# Patient Record
Sex: Male | Born: 1965 | Race: Black or African American | Hispanic: No | Marital: Married | Smoking: Former smoker
Health system: Southern US, Community
[De-identification: ages and names within clinical notes are randomized; demographics above are authoritative.]

## PROBLEM LIST (undated history)

## (undated) DIAGNOSIS — K219 Gastro-esophageal reflux disease without esophagitis: Secondary | ICD-10-CM

## (undated) DIAGNOSIS — R202 Paresthesia of skin: Secondary | ICD-10-CM

## (undated) DIAGNOSIS — R200 Anesthesia of skin: Secondary | ICD-10-CM

## (undated) DIAGNOSIS — J309 Allergic rhinitis, unspecified: Secondary | ICD-10-CM

## (undated) DIAGNOSIS — E785 Hyperlipidemia, unspecified: Secondary | ICD-10-CM

## (undated) DIAGNOSIS — R7303 Prediabetes: Secondary | ICD-10-CM

## (undated) HISTORY — PX: HERNIA REPAIR: SHX51

## (undated) HISTORY — DX: Paresthesia of skin: R20.2

## (undated) HISTORY — DX: Prediabetes: R73.03

## (undated) HISTORY — PX: NECK SURGERY: SHX720

## (undated) HISTORY — PX: FOOT SURGERY: SHX648

## (undated) HISTORY — DX: Hyperlipidemia, unspecified: E78.5

## (undated) HISTORY — DX: Gastro-esophageal reflux disease without esophagitis: K21.9

## (undated) HISTORY — DX: Allergic rhinitis, unspecified: J30.9

---

## 1999-09-19 ENCOUNTER — Encounter: Payer: Self-pay | Admitting: Emergency Medicine

## 1999-09-19 ENCOUNTER — Emergency Department (HOSPITAL_COMMUNITY): Admission: EM | Admit: 1999-09-19 | Discharge: 1999-09-19 | Payer: Self-pay | Admitting: Emergency Medicine

## 2000-06-04 ENCOUNTER — Other Ambulatory Visit: Admission: RE | Admit: 2000-06-04 | Discharge: 2000-06-04 | Payer: Self-pay | Admitting: Family Medicine

## 2000-06-24 ENCOUNTER — Emergency Department (HOSPITAL_COMMUNITY): Admission: EM | Admit: 2000-06-24 | Discharge: 2000-06-24 | Payer: Self-pay | Admitting: *Deleted

## 2000-06-26 ENCOUNTER — Encounter: Admission: RE | Admit: 2000-06-26 | Discharge: 2000-06-26 | Payer: Self-pay | Admitting: Orthopedic Surgery

## 2001-03-04 ENCOUNTER — Emergency Department (HOSPITAL_COMMUNITY): Admission: EM | Admit: 2001-03-04 | Discharge: 2001-03-04 | Payer: Self-pay

## 2004-09-19 ENCOUNTER — Emergency Department (HOSPITAL_COMMUNITY): Admission: EM | Admit: 2004-09-19 | Discharge: 2004-09-20 | Payer: Self-pay | Admitting: Emergency Medicine

## 2009-06-09 ENCOUNTER — Encounter: Admission: RE | Admit: 2009-06-09 | Discharge: 2009-06-09 | Payer: Self-pay | Admitting: Neurology

## 2009-06-18 ENCOUNTER — Encounter: Admission: RE | Admit: 2009-06-18 | Discharge: 2009-06-18 | Payer: Self-pay

## 2009-06-24 ENCOUNTER — Encounter: Admission: RE | Admit: 2009-06-24 | Discharge: 2009-06-24 | Payer: Self-pay | Admitting: Neurology

## 2009-08-23 ENCOUNTER — Ambulatory Visit (HOSPITAL_COMMUNITY): Admission: RE | Admit: 2009-08-23 | Discharge: 2009-08-23 | Payer: Self-pay | Admitting: Neurology

## 2010-01-17 ENCOUNTER — Encounter: Admission: RE | Admit: 2010-01-17 | Discharge: 2010-01-17 | Payer: Self-pay | Admitting: Neurology

## 2010-06-26 ENCOUNTER — Encounter: Payer: Self-pay | Admitting: Neurology

## 2010-08-29 LAB — CSF CELL COUNT WITH DIFFERENTIAL

## 2010-08-29 LAB — OLIGOCLONAL BANDS, CSF + SERM

## 2010-08-29 LAB — PROTEIN AND GLUCOSE, CSF
Glucose, CSF: 58 mg/dL (ref 43–76)
Total  Protein, CSF: 71 mg/dL — ABNORMAL HIGH (ref 15–45)

## 2011-05-25 ENCOUNTER — Emergency Department (INDEPENDENT_AMBULATORY_CARE_PROVIDER_SITE_OTHER)
Admission: EM | Admit: 2011-05-25 | Discharge: 2011-05-25 | Disposition: A | Discharge status: Home / Self Care | Payer: 59 | Source: Home / Self Care | Attending: Family Medicine | Admitting: Family Medicine

## 2011-05-25 ENCOUNTER — Encounter: Payer: Self-pay | Admitting: Emergency Medicine

## 2011-05-25 DIAGNOSIS — S39012A Strain of muscle, fascia and tendon of lower back, initial encounter: Secondary | ICD-10-CM

## 2011-05-25 DIAGNOSIS — S339XXA Sprain of unspecified parts of lumbar spine and pelvis, initial encounter: Secondary | ICD-10-CM

## 2011-05-25 HISTORY — DX: Anesthesia of skin: R20.0

## 2011-05-25 MED ORDER — HYDROCODONE-ACETAMINOPHEN 5-325 MG PO TABS: 2.0000 | ORAL_TABLET | ORAL | Status: AC | PRN

## 2011-05-25 MED ORDER — METAXALONE 800 MG PO TABS: 800.0000 mg | ORAL_TABLET | Freq: Three times a day (TID) | ORAL | Status: AC

## 2011-05-25 MED ORDER — PREDNISONE 20 MG PO TABS: ORAL_TABLET | ORAL | Status: AC

## 2011-05-25 MED ORDER — MELOXICAM 15 MG PO TABS: 15.0000 mg | ORAL_TABLET | Freq: Every day | ORAL | Status: AC

## 2011-05-25 NOTE — ED Provider Notes (Signed)
History     CSN: [id number]  Arrival date & time 05/25/11  1025   First MD Initiated Contact with Patient 05/25/11 1131      Chief Complaint  Patient presents with  . Back Pain    HPI Comments: Jonathan Downs is a 45 y.o. male who complains of sharp constant low back pain in lubosacral area x 2 days. No known injury, change in physical activity. The pain is positional with bending or lifting, w/o radiation down the legs.  Similar sx before but not as severe.  No N/V, cougfh, wheeze, CP, SOB, urinary c/o, fevers, abd pain, saddle anesthesia, unintentional weight loss, h/o IVDu, h/o CA, weakness in legs. States no change in numbness in feet. Has had extensive neurology workup for foot numbness in past with MRI's which were neg per pt and spouse. Tried heating pad,  vicodin left over from dental surgery  with improvement. Has not tried anything else for pain.   The history is provided by the patient and the spouse. No language interpreter was used.    Past Medical History  Diagnosis Date  . Numbness in feet     Past Surgical History  Procedure Date  . Hernia repair   . Foot surgery   . Neck surgery     History reviewed. No pertinent family history.  History  Substance Use Topics  . Smoking status: Current Everyday Smoker  . Smokeless tobacco: Not on file  . Alcohol Use: Yes      Review of Systems  All other systems reviewed and are negative.    Allergies  Review of patient's allergies indicates no known allergies.  Home Medications   Current Outpatient Rx  Name Route Sig Dispense Refill  . HYDROCODONE-ACETAMINOPHEN 5-325 MG PO TABS Oral Take 2 tablets by mouth every 4 (four) hours as needed for pain. 20 tablet 0  . MELOXICAM 15 MG PO TABS Oral Take 1 tablet (15 mg total) by mouth daily. 14 tablet 0  . METAXALONE 800 MG PO TABS Oral Take 1 tablet (800 mg total) by mouth 3 (three) times daily. 21 tablet 0  . PREDNISONE 20 MG PO TABS  Take 3 tabs po on first day,  2 tabs second day, 2 tabs third day, 1 tab fourth day, 1 tab 5th day. Take with food. 9 tablet 0    BP 112/84  Pulse 62  Temp(Src) 98 F (36.7 C) (Oral)  Resp 12  SpO2 99%  Physical Exam  Nursing note and vitals reviewed. Constitutional: He is oriented to person, place, and time. He appears well-developed and well-nourished.  HENT:  Head: Normocephalic and atraumatic.  Eyes: Conjunctivae and EOM are normal. Pupils are equal, round, and reactive to light.  Neck: Normal range of motion.  Cardiovascular: Normal rate, regular rhythm, normal heart sounds and normal pulses.   Pulmonary/Chest: Effort normal. No respiratory distress.  Abdominal: Soft. Bowel sounds are normal. He exhibits no distension and no mass. There is no tenderness. There is no guarding and no CVA tenderness.  Musculoskeletal: Normal range of motion.       Bilateral paralumbar and upper sacral soft tissue tenderness. No bony tenderness of L spine, sacrum. Bilateral lower extremities nontender  baseline ROM with intact PT pulses,  Sensation baseline light touch bilaterally for Pt, DTR's symmetric and intact bilaterally KJ, Motor symmetric bilateral 5/5 hip flexion, quadriceps, hamstrings, EHL, foot dorsiflexion, foot plantarflexion, gait normal. No pain with int/ext rotation hips, SLR neg bilaterally. Pain with bending forward,  going from lying to sitting, torso rotation.    Neurological: He is alert and oriented to person, place, and time.  Skin: Skin is warm and dry.  Psychiatric: He has a normal mood and affect. His behavior is normal.    ED Course  Procedures (including critical care time)  Labs Reviewed - No data to display No results found.   1. Lumbosacral strain       MDM  [address] narcotic database queried one narcotic rx from 12/22/2010 filled in past year. No previous records available.  No evidence of uti, nephrolithiasis. No evidence of spinal cord involvement based on H&P. Pt describing typical back  pain, has been <6 week duration. No red flags such as fevers, age >50, h/o trauma with bony tenderness, neurological deficits, h/o CA, unexplained weight loss, pain worse at night, pain at rest,  h/o prolonged steroid use, h/o osteopenia, h/o IVDU. Imaging not indicated at this time.     PLAN: For acute pain, rest, intermittent application of cold packs (later, may switch to heat, but do not sleep on heating pad), analgesics and muscle relaxants, steriods. Proper lifting with avoidance of heavy lifting discussed. Consider Physical Therapy and XRay studies if not improving. Call or return to clinic prn if these symptoms worsen or fail to improve as anticipated.   Luiz Blare, MD 05/26/11 [phone]

## 2011-05-25 NOTE — ED Notes (Signed)
Pt here with sudden lower back pain without injury or strain that started yesterday.pt c/o intermitt throb pain with any movement or certain position.denies radiation pain

## 2018-02-05 ENCOUNTER — Emergency Department (HOSPITAL_COMMUNITY): Payer: BC Managed Care – PPO

## 2018-02-05 ENCOUNTER — Other Ambulatory Visit: Payer: Self-pay

## 2018-02-05 ENCOUNTER — Encounter (HOSPITAL_COMMUNITY): Payer: Self-pay

## 2018-02-05 ENCOUNTER — Emergency Department (HOSPITAL_COMMUNITY)
Admission: EM | Admit: 2018-02-05 | Discharge: 2018-02-06 | Disposition: A | Discharge status: Home / Self Care | Payer: BC Managed Care – PPO | Attending: Emergency Medicine | Admitting: Emergency Medicine

## 2018-02-05 DIAGNOSIS — M79621 Pain in right upper arm: Secondary | ICD-10-CM | POA: Diagnosis not present

## 2018-02-05 DIAGNOSIS — R079 Chest pain, unspecified: Secondary | ICD-10-CM | POA: Diagnosis present

## 2018-02-05 DIAGNOSIS — Z87891 Personal history of nicotine dependence: Secondary | ICD-10-CM | POA: Diagnosis not present

## 2018-02-05 DIAGNOSIS — R072 Precordial pain: Secondary | ICD-10-CM | POA: Diagnosis not present

## 2018-02-05 LAB — BASIC METABOLIC PANEL
Anion gap: 10 (ref 5–15)
BUN: 16 mg/dL (ref 6–20)
CHLORIDE: 103 mmol/L (ref 98–111)
CO2: 25 mmol/L (ref 22–32)
CREATININE: 1.43 mg/dL — AB (ref 0.61–1.24)
Calcium: 9.4 mg/dL (ref 8.9–10.3)
GFR calc non Af Amer: 56 mL/min — ABNORMAL LOW (ref >60)
Glucose, Bld: 102 mg/dL — ABNORMAL HIGH (ref 70–99)
POTASSIUM: 3.2 mmol/L — AB (ref 3.5–5.1)
SODIUM: 138 mmol/L (ref 135–145)

## 2018-02-05 LAB — CBC
HCT: 45.8 % (ref 39.0–52.0)
Hemoglobin: 14.3 g/dL (ref 13.0–17.0)
MCH: 26.9 pg (ref 26.0–34.0)
MCHC: 31.2 g/dL (ref 30.0–36.0)
MCV: 86.1 fL (ref 78.0–100.0)
Platelets: 233 10*3/uL (ref 150–400)
RBC: 5.32 MIL/uL (ref 4.22–5.81)
RDW: 13.1 % (ref 11.5–15.5)
WBC: 5.5 10*3/uL (ref 4.0–10.5)

## 2018-02-05 LAB — I-STAT TROPONIN, ED: Troponin i, poc: 0 ng/mL (ref 0.00–0.08)

## 2018-02-05 NOTE — ED Provider Notes (Signed)
Patient placed in Quick Look pathway, seen and evaluated   Chief Complaint: CP  HPI:   Patient is a 52 year old male who presents the emergency department today for evaluation of right-sided chest pain and right arm pain that has been intermittent for the last week.  Pain began the day after he worked out.  Rates pain 6/10.  Not provoked by exertion.  Not associated with shortness of breath however does report intermittent pain with inspiration.  No lower extremity swelling.  No recent admissions to the hospital, surgeries or periods of extended travel.  No personal history of cancer or DVT/PE.  Patient has history of "borderline high blood pressure "but is not on medication for this.  Denies tobacco use or history of hyperlipidemia.  ROS: Chest pain (one)  Physical Exam:   Gen: No distress  Neuro: Awake and Alert  Skin: Warm    Focused Exam: No right-sided chest wall tenderness or tenderness to the right upper extremity.  RRR. CTAB. Distal pulses intact. Not diaphoretic. No BLE edema.   Initiation of care has begun. The patient has been counseled on the process, plan, and necessity for staying for the completion/evaluation, and the remainder of the medical screening examination  Pt advised to inform nursing staff immediately if they experience any new or worsening of symptoms while waiting in the waiting room.    Bishop Dublin 02/05/18 Zoe Lan, MD 02/06/18 [phone]

## 2018-02-05 NOTE — ED Triage Notes (Signed)
Pt here from home for chest pain and right arm for for a week.  Stated that the pain is central chest and comes and goes.  A&Ox4.

## 2018-02-06 NOTE — ED Provider Notes (Signed)
[facility] EMERGENCY DEPARTMENT Provider Note   CSN: [id number] Arrival date & time: 02/05/18  1062     History   Chief Complaint Chief Complaint  Patient presents with  . Chest Pain  . Arm Pain    HPI Jonathan Downs is a 52 y.o. male.  HPI 52 year old male presents emergency department with intermittent right axillary and right lateral chest discomfort which comes and goes.  He just started lifting weights again.  He has not tried any medication prior to arrival.  His symptoms have been ongoing for the past 6 days.  Is worse with palpation and movement of his right lateral chest and pectoral region.  No weakness or numbness in his right arm.  Normal grip strength right arm.   Past Medical History:  Diagnosis Date  . Numbness in feet     There are no active problems to display for this patient.   Past Surgical History:  Procedure Laterality Date  . FOOT SURGERY    . HERNIA REPAIR    . NECK SURGERY          Home Medications    Prior to Admission medications   Not on File    Family History History reviewed. No pertinent family history.  Social History Social History   Tobacco Use  . Smoking status: Former Research scientist (life sciences)  . Smokeless tobacco: Never Used  Substance Use Topics  . Alcohol use: Yes  . Drug use: No     Allergies   Patient has no known allergies.   Review of Systems Review of Systems  All other systems reviewed and are negative.    Physical Exam Updated Vital Signs BP (!) 156/104 (BP Location: Left Arm)   Pulse (!) 54   Temp 97.6 F (36.4 C) (Oral)   Resp 14   SpO2 100%   Physical Exam  Constitutional: He is oriented to person, place, and time. He appears well-developed and well-nourished.  HENT:  Head: Normocephalic and atraumatic.  Eyes: EOM are normal.  Neck: Normal range of motion.  Cardiovascular: Normal rate, regular rhythm, normal heart sounds and intact distal pulses.  Pulmonary/Chest: Effort normal  and breath sounds normal. No respiratory distress.  Abdominal: Soft. He exhibits no distension. There is no tenderness.  Musculoskeletal: Normal range of motion.  Right lateral chest wall tenderness.  No axillary lymph nodes palpable.  Neurological: He is alert and oriented to person, place, and time.  Skin: Skin is warm and dry.  Psychiatric: He has a normal mood and affect. Judgment normal.  Nursing note and vitals reviewed.    ED Treatments / Results  Labs (all labs ordered are listed, but only abnormal results are displayed) Labs Reviewed  BASIC METABOLIC PANEL - Abnormal; Notable for the following components:      Result Value   Potassium 3.2 (*)    Glucose, Bld 102 (*)    Creatinine, Ser 1.43 (*)    GFR calc non Af Amer 56 (*)    All other components within normal limits  CBC  I-STAT TROPONIN, ED    EKG EKG Interpretation  Date/Time:  Tuesday February 05 2018 18:26:20 EDT Ventricular Rate:  72 PR Interval:  150 QRS Duration: 100 QT Interval:  376 QTC Calculation: 411 R Axis:   63 Text Interpretation:  Normal sinus rhythm Biatrial enlargement Abnormal ECG No old tracing to compare Confirmed by Jola Schmidt [phone]) on 02/06/2018 12:46:19 AM   Radiology Dg Chest 2 View  Result Date:  02/05/2018 CLINICAL DATA:  52 y/o M; right chest pain over the right breast and right arm pain at the elbow for 5 days. Two days of fracture cough. EXAM: CHEST - 2 VIEW COMPARISON:  06/18/2009 CT chest. FINDINGS: The heart size and mediastinal contours are within normal limits. Both lungs are clear. Multilevel degenerative changes of the thoracic spine. IMPRESSION: No active cardiopulmonary disease. Electronically Signed   By: Kristine Garbe M.D.   On: 02/05/2018 20:47    Procedures Procedures (including critical care time)  Medications Ordered in ED Medications - No data to display   Initial Impression / Assessment and Plan / ED Course  I have reviewed the triage vital  signs and the nursing notes.  Pertinent labs & imaging results that were available during my care of the patient were reviewed by me and considered in my medical decision making (see chart for details).     Likely musculoskeletal chest pain.  Doubt ACS.  Doubt PE.  Doubt dissection.  Final Clinical Impressions(s) / ED Diagnoses   Final diagnoses:  Chest pain, unspecified type  Precordial chest pain    ED Discharge Orders    None       Jola Schmidt, MD 02/06/18 [phone]

## 2018-02-06 NOTE — ED Notes (Signed)
ED Provider at bedside. 

## 2018-02-06 NOTE — ED Notes (Signed)
PT states understanding of care given, follow up care. PT ambulated from ED to car with a steady gait.  

## 2018-02-06 NOTE — Discharge Instructions (Addendum)
Take ibuprofen and tylenol as needed for your symptoms

## 2019-08-05 ENCOUNTER — Other Ambulatory Visit: Payer: Self-pay

## 2019-08-05 ENCOUNTER — Encounter: Payer: Self-pay | Admitting: Neurology

## 2019-08-05 ENCOUNTER — Ambulatory Visit: Payer: BC Managed Care – PPO | Admitting: Neurology

## 2019-08-05 VITALS — BP 148/95 | HR 61 | Temp 97.6°F | Ht 69.0 in | Wt 236.5 lb

## 2019-08-05 DIAGNOSIS — M4714 Other spondylosis with myelopathy, thoracic region: Secondary | ICD-10-CM | POA: Diagnosis not present

## 2019-08-05 DIAGNOSIS — G959 Disease of spinal cord, unspecified: Secondary | ICD-10-CM

## 2019-08-05 DIAGNOSIS — R200 Anesthesia of skin: Secondary | ICD-10-CM | POA: Diagnosis not present

## 2019-08-05 DIAGNOSIS — M4804 Spinal stenosis, thoracic region: Secondary | ICD-10-CM | POA: Insufficient documentation

## 2019-08-05 DIAGNOSIS — R202 Paresthesia of skin: Secondary | ICD-10-CM

## 2019-08-05 NOTE — Progress Notes (Addendum)
[facility]  PATIENT: Jonathan Downs DOB: 12/31/1965  REFERRING DOCTOR OR PCP: Shirline Frees, MD SOURCE: Patient, notes from Dr. Kenton Kingfisher, imaging reports, MRI images personally reviewed.  _________________________________   HISTORICAL  CHIEF COMPLAINT:  Chief Complaint  Patient presents with  . New Patient (Initial Visit)    RM 12, alone. Paper referral from Dr. Dema Severin for muscle twitches/leg weakness. He has facial twitching, bilaterally. Started beginning of Feb 2021. Also has twitching in arms/legs intermittently for several months. Legs are weak, hard for him to go up stairs. He feels unsteady.    HISTORY OF PRESENT ILLNESS:  I had the pleasure of seeing patient, Jonathan Downs, at [facility] for neurologic consultation regarding his left greater than right leg numbness and occasional muscle twitching.  He is a 54 year old man who has noted more numbness in the legs, left greater than right, over the last month.  Additionally he has noted some muscle twitching at times in the legs.   In 2011, he had left leg discomfort, some left leg numbness and leg weakness and saw neurology.   MRI 06/24/2009 showed spinal stenosis at T2-T3 and myelopathic signal within the spinal cord.  He was referred to neurosurgery but opted not to have any procedure.   He felt he improved some and was stable until recently.  Currently, he denies weakness but notes he does not lift his leg up as much as he used to when he walks at times.  He is able to walk 2 miles but feels mildly weaker by the end.  He can go up and downstairs without holding the bannister.    For many years, he notes when he takes a shower that the left leg does not feel the temperature of the water as well as the right leg does.  More recently he has noted a little bit of numbness in the right leg as well.  He denies any symptoms in the arms.  Vision is fine.Marland Kitchen  He notes nocturia about 3 times most  nights and notes some frequency and urgency during the day  I reviewed MRIs of the cervical spine and lumbar spine from June 09, 2009, MRI of the thoracic spine from 06/24/2009 and 01/17/2010.  The MRI of the thoracic spine shows moderate spinal stenosis at T2-T3 mostly due to ligamenta flava hypertrophy.  There is myelopathic signal within the spinal cord bilaterally, left greater than right above and below this level.  There are multilevel degenerative changes at other thoracic spine levels as well without spinal stenosis.  There are milder degenerative changes in the cervical spine that did not lead to spinal stenosis.   REVIEW OF SYSTEMS: Constitutional: No fevers, chills, sweats, or change in appetite Eyes: No visual changes, double vision, eye pain Ear, nose and throat: No hearing loss, ear pain, nasal congestion, sore throat Cardiovascular: No chest pain, palpitations Respiratory: No shortness of breath at rest or with exertion.   No wheezes GastrointestinaI: No nausea, vomiting, diarrhea, abdominal pain, fecal incontinence Genitourinary: No dysuria, urinary retention or frequency.  No nocturia. Musculoskeletal: No neck pain, back pain Integumentary: No rash, pruritus, skin lesions Neurological: as above Psychiatric: No depression at this time.  No anxiety Endocrine: No palpitations, diaphoresis, change in appetite, change in weigh or increased thirst Hematologic/Lymphatic: No anemia, purpura, petechiae. Allergic/Immunologic: No itchy/runny eyes, nasal congestion, recent allergic reactions, rashes  ALLERGIES: No Known Allergies  HOME MEDICATIONS: No current outpatient medications on file.  PAST MEDICAL  HISTORY: Past Medical History:  Diagnosis Date  . Allergic rhinitis   . Esophageal reflux   . Mild hyperlipidemia   . Numbness in feet   . Paresthesia of both legs   . Prediabetes     PAST SURGICAL HISTORY: Past Surgical History:  Procedure Laterality Date  . FOOT  SURGERY     corn removed  . HERNIA REPAIR    . NECK SURGERY     keloid removed    FAMILY HISTORY: History reviewed. No pertinent family history.  SOCIAL HISTORY:  Social History   Socioeconomic History  . Marital status: Married    Spouse name: Jonathan Downs  . Number of children: 4  . Years of education: Not on file  . Highest education level: Not on file  Occupational History  . Occupation: [employer]  Tobacco Use  . Smoking status: Former Smoker    Types: Cigars  . Smokeless tobacco: Never Used  Substance and Sexual Activity  . Alcohol use: Yes    Comment: weekly  . Drug use: No  . Sexual activity: Not on file  Other Topics Concern  . Not on file  Social History Narrative   Lives with wife and kids   Right handed    Caffeine [id number] sometimes   No coffee or tea   Social Determinants of Health   Financial Resource Strain:   . Difficulty of Paying Living Expenses: Not on file  Food Insecurity:   . Worried About Charity fundraiser in the Last Year: Not on file  . Ran Out of Food in the Last Year: Not on file  Transportation Needs:   . Lack of Transportation (Medical): Not on file  . Lack of Transportation (Non-Medical): Not on file  Physical Activity:   . Days of Exercise per Week: Not on file  . Minutes of Exercise per Session: Not on file  Stress:   . Feeling of Stress : Not on file  Social Connections:   . Frequency of Communication with Friends and Family: Not on file  . Frequency of Social Gatherings with Friends and Family: Not on file  . Attends Religious Services: Not on file  . Active Member of Clubs or Organizations: Not on file  . Attends Archivist Meetings: Not on file  . Marital Status: Not on file  Intimate Partner Violence:   . Fear of Current or Ex-Partner: Not on file  . Emotionally Abused: Not on file  . Physically Abused: Not on file  . Sexually Abused: Not on file     PHYSICAL EXAM  Vitals:   08/05/19 1035  BP:  (!) 148/95  Pulse: 61  Temp: 97.6 F (36.4 C)  Weight: 236 lb 8 oz (107.3 kg)  Height: 5\' 9"  (1.753 m)    Body mass index is 34.92 kg/m.   General: The patient is well-developed and well-nourished and in no acute distress  HEENT:  Head is [place]/AT.  Sclera are anicteric.    Neck: No carotid bruits are noted.  The neck is nontender.  Cardiovascular: The heart has a regular rate and rhythm with a normal S1 and S2. There were no murmurs, gallops or rubs.    Skin: Extremities are without rash or  edema.  Musculoskeletal:  Back is nontender  Neurologic Exam  Mental status: The patient is alert and oriented x 3 at the time of the examination. The patient has apparent normal recent and remote memory, with an apparently normal attention span and  concentration ability.   Speech is normal.  Cranial nerves: Extraocular movements are full. There is good facial sensation to soft touch bilaterally.Facial strength is normal.  Trapezius and sternocleidomastoid strength is normal. No dysarthria is noted. No obvious hearing deficits are noted.  Motor:  Muscle bulk is normal.   Tone is normal. Strength is  5 / 5 in all 4 extremities.   Sensory: Sensory testing is intact to pinprick, soft touch and vibration sensation in the arms.   He notes markedly reduced touch, pinprick nd temperature in left lag than right and notes numbness below T5 dermatome in chest and abdomen on the left but normal on the right.    Vibration is also reduced on the left leg  Coordination: Cerebellar testing reveals good finger-nose-finger and heel-to-shin bilaterally.  Gait and station: Station is normal.   Gait is normal. Tandem gait is normal. Romberg is negative.   Reflexes: Deep tendon reflexes are symmetric and normal bilaterally.   Plantar responses are flexor.     DIAGNOSTIC DATA (LABS, IMAGING, TESTING) - I reviewed patient records, labs, notes, testing and imaging myself where available.  Lab Results    Component Value Date   WBC 5.5 02/05/2018   HGB 14.3 02/05/2018   HCT 45.8 02/05/2018   MCV 86.1 02/05/2018   PLT 233 02/05/2018      Component Value Date/Time   NA 138 02/05/2018 1840   K 3.2 (L) 02/05/2018 1840   CL 103 02/05/2018 1840   CO2 25 02/05/2018 1840   GLUCOSE 102 (H) 02/05/2018 1840   BUN 16 02/05/2018 1840   CREATININE 1.43 (H) 02/05/2018 1840   CALCIUM 9.4 02/05/2018 1840   GFRNONAA 56 (L) 02/05/2018 1840   GFRAA >60 02/05/2018 1840       ASSESSMENT AND PLAN  Disease of spinal cord ([place]) - Plan: MR THORACIC SPINE WO CONTRAST, MR CERVICAL SPINE WO CONTRAST  Numbness and tingling of both legs  Thoracic myelopathy  Thoracic spinal stenosis   In summary, Jonathan Downs is a 54 year old man with a known upper thoracic myelopathy reporting more symptoms with numbness and slight weakness, left greater than right.  On exam, he appears to have a sensory level on the left below about T5.  It is possible that the spinal stenosis has worsened and he is having more symptoms.  We will check an MRI of the cervical and thoracic spine and consider referral to neurosurgery based on the findings.  He will return to see me in several months or sooner if there are new or worsening neurologic symptoms.  Thank you for asking me to see Jonathan Downs.  Please let me know if I can be of further assistance with him or other patients in the future.   Amram Maya A. Felecia Shelling, MD, [facility] [id number], [id number] PM Certified in Neurology, Clinical Neurophysiology, Sleep Medicine and Neuroimaging  [facility] [address], [place], [place] [address] [phone]

## 2019-08-05 NOTE — Patient Instructions (Signed)
I think the problems in your legs are coming from the spinal cord issues found in 2011.     We will check MRIs and consider referring you to neurosurgery based on the results.

## 2019-08-14 ENCOUNTER — Telehealth: Payer: Self-pay | Admitting: Neurology

## 2019-08-14 NOTE — Telephone Encounter (Signed)
no to the covid questions MR Cervical spine wo contrast & MR Thoracic spine wo contrast Dr. Cheree Ditto Auth: [id number] (exp. 02/06/20). Patient is scheduled at [facility] for 08/20/19.

## 2019-08-14 NOTE — Addendum Note (Signed)
Addended byRonnald Ramp, Terrence Dupont L on: 08/14/2019 12:00 PM   Modules accepted: Orders

## 2019-08-14 NOTE — Telephone Encounter (Signed)
He also informed me that he is claustrophobic and would like something to help him. He is aware to have a driver.

## 2019-08-15 MED ORDER — ALPRAZOLAM 0.5 MG PO TABS: ORAL_TABLET | ORAL | 0 refills | Status: DC

## 2019-08-15 NOTE — Addendum Note (Signed)
Addended by: Arlice Colt A on: 08/15/2019 03:21 PM   Modules accepted: Orders

## 2019-08-20 ENCOUNTER — Other Ambulatory Visit: Payer: Self-pay

## 2019-08-20 ENCOUNTER — Ambulatory Visit: Payer: BC Managed Care – PPO

## 2019-08-20 ENCOUNTER — Telehealth: Payer: Self-pay | Admitting: Neurology

## 2019-08-20 DIAGNOSIS — M4714 Other spondylosis with myelopathy, thoracic region: Secondary | ICD-10-CM

## 2019-08-20 DIAGNOSIS — R200 Anesthesia of skin: Secondary | ICD-10-CM

## 2019-08-20 DIAGNOSIS — R202 Paresthesia of skin: Secondary | ICD-10-CM

## 2019-08-20 DIAGNOSIS — G959 Disease of spinal cord, unspecified: Secondary | ICD-10-CM

## 2019-08-20 DIAGNOSIS — M4804 Spinal stenosis, thoracic region: Secondary | ICD-10-CM

## 2019-08-20 NOTE — Telephone Encounter (Signed)
I spoke with Jonathan Downs.  The MRI of the spine shows the spinal stenosis at T2-T3.  Although it is not clear that the spinal stenosis has worsened (unfortunately the axial thoracic images have movement artifact) there is clearly more T2 hyperintensity within the spinal cord, especially above this spinal stenosis.  Currently signal within the spinal cord is seen from C7 to T3.  As he is more symptomatic and the MRI shows changes I would like him to see a neurosurgeon about the possibility of decompression at that level.  He is in agreement and would like [place] to place the referral.

## 2019-11-10 ENCOUNTER — Ambulatory Visit: Payer: BC Managed Care – PPO | Admitting: Neurology

## 2019-12-16 ENCOUNTER — Encounter: Payer: Self-pay | Admitting: Neurology

## 2019-12-16 ENCOUNTER — Ambulatory Visit: Payer: BC Managed Care – PPO | Admitting: Neurology

## 2019-12-16 VITALS — BP 140/94 | HR 55 | Ht 69.0 in | Wt 227.5 lb

## 2019-12-16 DIAGNOSIS — M4804 Spinal stenosis, thoracic region: Secondary | ICD-10-CM | POA: Diagnosis not present

## 2019-12-16 DIAGNOSIS — R200 Anesthesia of skin: Secondary | ICD-10-CM | POA: Diagnosis not present

## 2019-12-16 DIAGNOSIS — M4714 Other spondylosis with myelopathy, thoracic region: Secondary | ICD-10-CM | POA: Diagnosis not present

## 2019-12-16 DIAGNOSIS — G8929 Other chronic pain: Secondary | ICD-10-CM

## 2019-12-16 DIAGNOSIS — M25512 Pain in left shoulder: Secondary | ICD-10-CM

## 2019-12-16 DIAGNOSIS — R202 Paresthesia of skin: Secondary | ICD-10-CM

## 2019-12-16 NOTE — Progress Notes (Signed)
[facility]  PATIENT: Jonathan Downs DOB: [birth year]  REFERRING DOCTOR OR PCP: Shirline Frees, MD SOURCE: Patient, notes from Dr. Kenton Kingfisher, imaging reports, MRI images personally reviewed.  _________________________________   HISTORICAL  CHIEF COMPLAINT:  Chief Complaint  Patient presents with  . Follow-up    RM 13, alone. Last seen 08/05/2019.Now on BP med, amlodipine 5mg  po qd.  Did not see neurosurgeon for spinal stenosis after referral placed in March 2021, states he was never contacted to get scheduled. He would still like to see neurosurgeon.  No new sx. Feels like he is losing mobillity over time in legs/arms.     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  Over the past 10 years, he has had progressive left > right leg numbness and mild left > right leg weakness.    He notes that it is a little harder for him to stand up and the left foot will sometimes drag if he walks longer distances.  I discussed the MRI findings with him.  The degenerative changes at T2-T3 are just slightly progressed compared to 2011.  However, the abnormal signal changes within the spinal cord from C7-T3 has increased.  I am concerned about the possibility of myelopathy.  When I went over the results of the MRI by phone, we had tried to set up a neurosurgery appointment but he did not follow through.  I discussed the importance of getting this consultation and he will follow through.  Additionally, the MRI shows a fluid collection adjacent to the rhomboid muscle on the left.  This has worsened compared to the small fluid collection back in 2011.  He reports he did have an aspiration at some point but he does not recall details.  He has swelling in the upper left back.  He notes some discomfort at  times but not severe pain.  Sometimes the discomfort will radiate to the shoulder.  MRI from 08/20/2019: IMPRESSION: This MRI of the thoracic spine without contrast shows the following: 1.   There is moderate spinal stenosis at T2-T3.  Above this level there is increased signal within the central spinal cord.  The upper limit of the T2 hyperintensity is C7-T1, best seen on the cervical images also performed today.  These changes are consistent with myelopathy.  On the 2011 MRI, the increase signal was only seen from T1-T2 through T2-T3 though there was also more intense T2 hyperintensity consistent with myelopathy both above and below the point of maximal stenosis. 2.   Mild degenerative changes elsewhere in the upper thoracic spine that do not lead to nerve root compression or spinal stenosis. 3.   Fluid collection adjacent to the rhomboid muscles on the left.  This was also noted on the older MRI but has enlarged  IMPRESSION: This MRI of the cervical spine without contrast shows the following: 1.    There is mild spinal stenosis at C3-C4 and C4-C5 and mild to moderate spinal stenosis at C5-C6 due to degenerative changes.  Additionally, there are various degrees of foraminal narrowing at these levels, worse at C4-C5 where there is possible right and potential left C5 nerve root compression. 2.   There is at least moderate spinal stenosis at T2-T3.  Above this there is increased signal within the spinal cord running up to  the C7-T1 interspace consistent with myelopathy.  The extent of the abnormal signal has increased compared to 2011 MRI.   REVIEW OF SYSTEMS: Constitutional: No fevers, chills, sweats, or change in appetite Eyes: No visual changes, double vision, eye pain Ear, nose and throat: No hearing loss, ear pain, nasal congestion, sore throat Cardiovascular: No chest pain, palpitations Respiratory: No shortness of breath at rest or with exertion.   No wheezes GastrointestinaI: No nausea,  vomiting, diarrhea, abdominal pain, fecal incontinence Genitourinary: No dysuria, urinary retention or frequency.  No nocturia. Musculoskeletal: No neck pain, back pain Integumentary: No rash, pruritus, skin lesions Neurological: as above Psychiatric: No depression at this time.  No anxiety Endocrine: No palpitations, diaphoresis, change in appetite, change in weigh or increased thirst Hematologic/Lymphatic: No anemia, purpura, petechiae. Allergic/Immunologic: No itchy/runny eyes, nasal congestion, recent allergic reactions, rashes  ALLERGIES: No Known Allergies  HOME MEDICATIONS:  Current Outpatient Medications:  .  amLODipine (NORVASC) 5 MG tablet, Take 5 mg by mouth daily., Disp: , Rfl:   PAST MEDICAL HISTORY: Past Medical History:  Diagnosis Date  . Allergic rhinitis   . Esophageal reflux   . Mild hyperlipidemia   . Numbness in feet   . Paresthesia of both legs   . Prediabetes     PAST SURGICAL HISTORY: Past Surgical History:  Procedure Laterality Date  . FOOT SURGERY     corn removed  . HERNIA REPAIR    . NECK SURGERY     keloid removed    FAMILY HISTORY: History reviewed. No pertinent family history.  SOCIAL HISTORY:  Social History   Socioeconomic History  . Marital status: Married    Spouse name: Quanell Loughney  . Number of children: 4  . Years of education: Not on file  . Highest education level: Not on file  Occupational History  . Occupation: [employer]  Tobacco Use  . Smoking status: Former Smoker    Types: Cigars  . Smokeless tobacco: Never Used  Substance and Sexual Activity  . Alcohol use: Yes    Comment: weekly  . Drug use: No  . Sexual activity: Not on file  Other Topics Concern  . Not on file  Social History Narrative   Lives with wife and kids   Right handed    Caffeine FMB:WGYK sometimes   No coffee or tea   Social Determinants of Health   Financial Resource Strain:   . Difficulty of Paying Living Expenses:   Food  Insecurity:   . Worried About Charity fundraiser in the Last Year:   . Arboriculturist in the Last Year:   Transportation Needs:   . Film/video editor (Medical):   Marland Kitchen Lack of Transportation (Non-Medical):   Physical Activity:   . Days of Exercise per Week:   . Minutes of Exercise per Session:   Stress:   . Feeling of Stress :   Social Connections:   . Frequency of Communication with Friends and Family:   . Frequency of Social Gatherings with Friends and Family:   . Attends Religious Services:   . Active Member of Clubs or Organizations:   . Attends Archivist Meetings:   Marland Kitchen Marital Status:   Intimate Partner Violence:   . Fear of Current or Ex-Partner:   . Emotionally Abused:   Marland Kitchen Physically Abused:   . Sexually Abused:      PHYSICAL EXAM  Vitals:   12/16/19 1058  BP: (!) 140/94  Pulse: (!) 55  Weight: 227 lb 8 oz (103.2 kg)  Height: 5\' 9"  (1.753 m)    Body mass index is 33.6 kg/m.   General: The patient is well-developed and well-nourished and in no acute distress  HEENT:  Head is [place]/AT.  Sclera are anicteric.    Skin: Extremities are without rash or edema.  Musculoskeletal: There is swelling in the upper left back.  This is just slightly tender.  Neurologic Exam  Mental status: The patient is alert and oriented x 3 at the time of the examination. The patient has apparent normal recent and remote memory, with an apparently normal attention span and concentration ability.   Speech is normal.  Cranial nerves: Extraocular movements are full. There is good facial sensation to soft touch bilaterally.Facial strength is normal.  Trapezius and sternocleidomastoid strength is normal. No dysarthria is noted. No obvious hearing deficits are noted.  Motor:  Muscle bulk is normal.   Tone is normal. Strength is 4+/5 in the left iliopsoas and foot muscles and 5/5 elsewhere  Sensory: Sensory testing is intact to pinprick, soft touch and vibration sensation in the  arms.   He notes markedly reduced touch, pinprick and temperature in left leg compared to right and notes numbness below T5 dermatome in chest and abdomen on the left but normal on the right.    Vibration sensation is also reduced in the left leg  Coordination: Cerebellar testing reveals good finger-nose-finger and heel-to-shin bilaterally.  Gait and station: Station is normal.   The gait shows mild drop on the left. Romberg is negative.   Reflexes: Deep tendon reflexes are symmetric in arms, 2 at right knee and 2+ ant left knee, 1 at right ankle and 2 at left ankle.   Plantar responses are flexor.     DIAGNOSTIC DATA (LABS, IMAGING, TESTING) - I reviewed patient records, labs, notes, testing and imaging myself where available.  Lab Results  Component Value Date   WBC 5.5 02/05/2018   HGB 14.3 02/05/2018   HCT 45.8 02/05/2018   MCV 86.1 02/05/2018   PLT 233 02/05/2018      Component Value Date/Time   NA 138 02/05/2018 1840   K 3.2 (L) 02/05/2018 1840   CL 103 02/05/2018 1840   CO2 25 02/05/2018 1840   GLUCOSE 102 (H) 02/05/2018 1840   BUN 16 02/05/2018 1840   CREATININE 1.43 (H) 02/05/2018 1840   CALCIUM 9.4 02/05/2018 1840   GFRNONAA 56 (L) 02/05/2018 1840   GFRAA >60 02/05/2018 1840       ASSESSMENT AND PLAN  Thoracic myelopathy - Plan: Ambulatory referral to Neurosurgery  Thoracic spinal stenosis - Plan: Ambulatory referral to Neurosurgery  Numbness and tingling of both legs - Plan: Ambulatory referral to Neurosurgery  Chronic left shoulder pain - Plan: Ambulatory referral to Orthopedic Surgery  1.  Referred to neurosurgery for T2-T3 spinal stenosis with abnormal signal in the adjacent spinal cord. 2.  Refer to orthopedic for fluid collection in left scapular/rhomboid region 3.  Return in 6 months or sooner if new or worsening neurologic symptoms.  Tashae Inda A. Felecia Shelling, MD, [facility] 0/25/8527, 7:82 PM Certified in Neurology, Clinical Neurophysiology, Sleep  Medicine and Neuroimaging  [facility] [address] [place], [place] [address] [phone]

## 2019-12-22 ENCOUNTER — Encounter: Payer: Self-pay | Admitting: Orthopaedic Surgery

## 2019-12-22 ENCOUNTER — Other Ambulatory Visit: Payer: Self-pay

## 2019-12-22 ENCOUNTER — Ambulatory Visit: Payer: BC Managed Care – PPO | Admitting: Orthopaedic Surgery

## 2019-12-22 VITALS — Ht 69.0 in | Wt 227.0 lb

## 2019-12-22 DIAGNOSIS — R222 Localized swelling, mass and lump, trunk: Secondary | ICD-10-CM

## 2019-12-22 NOTE — Progress Notes (Signed)
Office Visit Note   Patient: Jonathan Downs           Date of Birth: May 29, 1966           MRN: [id number] Visit Date: 12/22/2019              Requested by: Britt Bottom, MD [address] [place],  [place] [address] PCP: Shirline Frees, MD   Assessment & Plan: Visit Diagnoses:  1. Mass of thoracic structure     Plan: Since the patient stated that he had this area aspirated and decompressed before, I did clean the area well with Betadine and alcohol and then with an 18-gauge needle made several passes to try to aspirate any type of fluid collection from this area.  I was not successful with aspirating any fluid from this.  With that being said, I would like to refer him to Dr. Janice Coffin an orthopedic oncologist at [facility] for further evaluation and treatment of this mass.  I explained to the patient in detail why would like to make the referral and he agrees with this.  Follow-Up Instructions:    Orders:  No orders of the defined types were placed in this encounter.  No orders of the defined types were placed in this encounter.     Procedures: No procedures performed   Clinical Data: No additional findings.   Subjective: Chief Complaint  Patient presents with  . Left Shoulder - Edema  The patient is a 54 year old gentleman that I am seeing for the first time.  This is to evaluate and treat a left sided thoracic/parascapular mass.  He was first detected and apparently on a MRI back in 2011.  A repeat MRI of the thoracic spine recently shows that this is enlarged.  The patient reports that is been aspirated before.  He is never had surgery on this area.  There is no pain associated with it but it becomes uncomfortable when he lays on his back.  He has no other acute changes in his medical status.  Has been told that he needs some type of spine surgery at some point.  He is seeing me for this mass today.  HPI  Review of  Systems He currently denies any headache, chest pain, shortness of breath, fever, chills, nausea, vomiting  Objective: Vital Signs: Ht 5\' 9"  (1.753 m)   Wt 227 lb (103 kg)   BMI 33.52 kg/m   Physical Exam He is alert and orient x3 and in no acute distress Ortho Exam On examination, I had him lay in a prone position.  I was able to easily palpate a large soft tissue mass just to the lateral border of the medial scapula and just lateral to the thoracic spine.  It is easily mobile and does not appear to be adherent to any type of soft tissue. Specialty Comments:  No specialty comments available.  Imaging: No results found. The MRI is reviewed and you see a heterogeneous fluid collection in the left side of the scapula and thoracic spine.  It is well-circumscribed.  PMFS History: Patient Active Problem List   Diagnosis Date Noted  . Thoracic myelopathy 08/05/2019  . Numbness and tingling of both legs 08/05/2019  . Thoracic spinal stenosis 08/05/2019   Past Medical History:  Diagnosis Date  . Allergic rhinitis   . Esophageal reflux   . Mild hyperlipidemia   . Numbness in feet   . Paresthesia of both legs   .  Prediabetes     History reviewed. No pertinent family history.  Past Surgical History:  Procedure Laterality Date  . FOOT SURGERY     corn removed  . HERNIA REPAIR    . NECK SURGERY     keloid removed   Social History   Occupational History  . Occupation: [employer]  Tobacco Use  . Smoking status: Former Smoker    Types: Cigars  . Smokeless tobacco: Never Used  Substance and Sexual Activity  . Alcohol use: Yes    Comment: weekly  . Drug use: No  . Sexual activity: Not on file

## 2019-12-23 ENCOUNTER — Other Ambulatory Visit: Payer: Self-pay | Admitting: Radiology

## 2019-12-23 DIAGNOSIS — R222 Localized swelling, mass and lump, trunk: Secondary | ICD-10-CM

## 2020-01-08 DIAGNOSIS — I10 Essential (primary) hypertension: Secondary | ICD-10-CM | POA: Insufficient documentation

## 2020-02-02 DIAGNOSIS — D171 Benign lipomatous neoplasm of skin and subcutaneous tissue of trunk: Secondary | ICD-10-CM | POA: Insufficient documentation

## 2020-03-02 DIAGNOSIS — Z6832 Body mass index (BMI) 32.0-32.9, adult: Secondary | ICD-10-CM | POA: Insufficient documentation

## 2020-04-19 ENCOUNTER — Emergency Department (HOSPITAL_COMMUNITY)
Admission: EM | Admit: 2020-04-19 | Discharge: 2020-04-19 | Disposition: A | Discharge status: Home / Self Care | Payer: BC Managed Care – PPO | Attending: Emergency Medicine | Admitting: Emergency Medicine

## 2020-04-19 ENCOUNTER — Encounter (HOSPITAL_COMMUNITY): Payer: Self-pay | Admitting: Emergency Medicine

## 2020-04-19 ENCOUNTER — Other Ambulatory Visit: Payer: Self-pay

## 2020-04-19 DIAGNOSIS — M542 Cervicalgia: Secondary | ICD-10-CM | POA: Insufficient documentation

## 2020-04-19 DIAGNOSIS — Z79899 Other long term (current) drug therapy: Secondary | ICD-10-CM | POA: Insufficient documentation

## 2020-04-19 DIAGNOSIS — Z87891 Personal history of nicotine dependence: Secondary | ICD-10-CM | POA: Diagnosis not present

## 2020-04-19 DIAGNOSIS — M549 Dorsalgia, unspecified: Secondary | ICD-10-CM

## 2020-04-19 DIAGNOSIS — R030 Elevated blood-pressure reading, without diagnosis of hypertension: Secondary | ICD-10-CM | POA: Insufficient documentation

## 2020-04-19 MED ORDER — KETOROLAC TROMETHAMINE 15 MG/ML IJ SOLN: 15.0000 mg | Freq: Once | INTRAMUSCULAR | Status: DC

## 2020-04-19 MED ORDER — LIDOCAINE 5 % EX PTCH: 1.0000 | MEDICATED_PATCH | CUTANEOUS | Status: DC

## 2020-04-19 MED ORDER — METHOCARBAMOL 1000 MG/10ML IJ SOLN
500.0000 mg | Freq: Once | INTRAVENOUS | Status: DC
  Filled 2020-04-19: qty 5

## 2020-04-19 MED ORDER — HYDROMORPHONE HCL 1 MG/ML IJ SOLN: 0.5000 mg | Freq: Once | INTRAMUSCULAR | Status: DC

## 2020-04-19 NOTE — ED Triage Notes (Signed)
Pt reports R neck pain, sudden onset around 12 last night, denies any associated symptoms.

## 2020-04-19 NOTE — ED Notes (Signed)
Went into room to find gown and b/p cuff on the bed no patient

## 2020-04-19 NOTE — ED Provider Notes (Signed)
[facility] EMERGENCY DEPARTMENT Provider Note   CSN: [id number] Arrival date & time: 04/19/20  9741     History Chief Complaint  Patient presents with  . Neck Pain    Jonathan Downs is a 54 y.o. male.  54 year old male presents to the emergency department for evaluation of right sided neck and posterior shoulder pain.  Symptoms began more suddenly around 2330 tonight.  Pain has been constant, throbbing and unrelieved with 1 tablet of a muscle relaxer as well as 2 tablets of oxycodone.  Oxycodone left over from thoracic surgery in September.  Has noticed some aggravation with breathing, but no shortness of breath.  Denies trauma/injury, recent heavy lifting, fevers, vision changes or loss, difficulty swallowing, chest pain, extremity numbness or paresthesias, extremity weakness.  The history is provided by the patient. No language interpreter was used.  Neck Pain      Past Medical History:  Diagnosis Date  . Allergic rhinitis   . Esophageal reflux   . Mild hyperlipidemia   . Numbness in feet   . Paresthesia of both legs   . Prediabetes     Patient Active Problem List   Diagnosis Date Noted  . Thoracic myelopathy 08/05/2019  . Numbness and tingling of both legs 08/05/2019  . Thoracic spinal stenosis 08/05/2019    Past Surgical History:  Procedure Laterality Date  . FOOT SURGERY     corn removed  . HERNIA REPAIR    . NECK SURGERY     keloid removed       No family history on file.  Social History   Tobacco Use  . Smoking status: Former Smoker    Types: Cigars  . Smokeless tobacco: Never Used  Substance Use Topics  . Alcohol use: Yes    Comment: weekly  . Drug use: No    Home Medications Prior to Admission medications   Medication Sig Start Date End Date Taking? Authorizing Provider  amLODipine (NORVASC) 5 MG tablet Take 5 mg by mouth daily. 09/25/19   [provider]    Allergies    Patient has no known  allergies.  Review of Systems   Review of Systems  Musculoskeletal: Positive for neck pain.  Ten systems reviewed and are negative for acute change, except as noted in the HPI.    Physical Exam Updated Vital Signs BP (!) 179/85 (BP Location: Right Arm)   Pulse 75   Temp 98 F (36.7 C) (Oral)   Resp 18   SpO2 99%   Physical Exam Vitals and nursing note reviewed.  Constitutional:      General: He is not in acute distress.    Appearance: He is well-developed. He is not diaphoretic.     Comments: Nontoxic. Appears uncomfortable.  HENT:     Head: Normocephalic and atraumatic.  Eyes:     General: No scleral icterus.    Conjunctiva/sclera: Conjunctivae normal.  Neck:     Comments: No meningismus Cardiovascular:     Rate and Rhythm: Normal rate and regular rhythm.     Pulses: Normal pulses.  Pulmonary:     Effort: Pulmonary effort is normal. No respiratory distress.     Comments: Lungs CTAB. Respirations even and unlabored. Musculoskeletal:     Cervical back: Normal range of motion.       Back:     Comments: No bony deformities, step-offs, crepitus to the cervical, thoracic, lumbosacral midline.  Tenderness along the course of the right trapezius with mild  spasm.    Skin:    General: Skin is warm and dry.     Coloration: Skin is not pale.     Findings: No erythema or rash.  Neurological:     Mental Status: He is alert and oriented to person, place, and time.     Coordination: Coordination normal.     Comments: GCS 15. Speech is goal oriented. Sensation to light touch intact. Patient moves extremities without ataxia.  Normal shoulder shrug against resistance.  Normal finger-nose-finger. Patient ambulatory with steady gait.  Psychiatric:        Behavior: Behavior normal.     ED Results / Procedures / Treatments   Labs (all labs ordered are listed, but only abnormal results are displayed) Labs Reviewed - No data to display  EKG None  Radiology No results  found.  Procedures Procedures (including critical care time)  Medications Ordered in ED Medications  ketorolac (TORADOL) 15 MG/ML injection 15 mg (has no administration in time range)  methocarbamol (ROBAXIN) 500 mg in dextrose 5 % 50 mL IVPB (has no administration in time range)  lidocaine (LIDODERM) 5 % 1 patch (has no administration in time range)  HYDROmorphone (DILAUDID) injection 0.5 mg (has no administration in time range)    ED Course  I have reviewed the triage vital signs and the nursing notes.  Pertinent labs & imaging results that were available during my care of the patient were reviewed by me and considered in my medical decision making (see chart for details).    MDM Rules/Calculators/A&P                          54 year old male presenting for evaluation of right upper back pain which began more suddenly tonight.  Neurovascularly intact without history of trauma or injury.  Symptoms reproducible on exam.  Discussed plan for pain control and reassessment.  30 minutes after bedside evaluation, RN went into room to find gown and blood pressure cuff on the bed; patient unable to be located and presumed to have eloped. Disposition set to [facility].   Final Clinical Impression(s) / ED Diagnoses Final diagnoses:  Upper back pain on right side    Rx / DC Orders ED Discharge Orders    None       Antonietta Breach, PA-C 04/19/20 0604    Ripley Fraise, MD 04/19/20 [id number]

## 2020-06-21 ENCOUNTER — Ambulatory Visit: Payer: BC Managed Care – PPO | Admitting: Neurology

## 2020-07-08 ENCOUNTER — Other Ambulatory Visit: Payer: Self-pay | Admitting: Family Medicine

## 2020-07-09 ENCOUNTER — Other Ambulatory Visit: Payer: Self-pay | Admitting: Family Medicine

## 2020-07-09 DIAGNOSIS — M79605 Pain in left leg: Secondary | ICD-10-CM

## 2020-07-19 ENCOUNTER — Ambulatory Visit
Admission: RE | Admit: 2020-07-19 | Discharge: 2020-07-19 | Disposition: A | Discharge status: Home / Self Care | Payer: BC Managed Care – PPO | Source: Ambulatory Visit | Attending: Family Medicine | Admitting: Family Medicine

## 2020-07-19 DIAGNOSIS — M79605 Pain in left leg: Secondary | ICD-10-CM

## 2020-09-13 ENCOUNTER — Other Ambulatory Visit: Payer: Self-pay

## 2020-09-13 ENCOUNTER — Other Ambulatory Visit: Payer: Self-pay | Admitting: Physician Assistant

## 2020-09-13 ENCOUNTER — Ambulatory Visit
Admission: RE | Admit: 2020-09-13 | Discharge: 2020-09-13 | Disposition: A | Discharge status: Home / Self Care | Payer: BC Managed Care – PPO | Source: Ambulatory Visit | Attending: Physician Assistant | Admitting: Physician Assistant

## 2020-09-13 DIAGNOSIS — R52 Pain, unspecified: Secondary | ICD-10-CM

## 2020-12-09 ENCOUNTER — Other Ambulatory Visit: Payer: Self-pay

## 2020-12-09 ENCOUNTER — Telehealth: Payer: Self-pay | Admitting: Neurology

## 2020-12-09 ENCOUNTER — Ambulatory Visit: Payer: BC Managed Care – PPO | Admitting: Neurology

## 2020-12-09 ENCOUNTER — Encounter: Payer: Self-pay | Admitting: Neurology

## 2020-12-09 VITALS — BP 136/92 | HR 61 | Ht 69.0 in | Wt 220.0 lb

## 2020-12-09 DIAGNOSIS — R200 Anesthesia of skin: Secondary | ICD-10-CM

## 2020-12-09 DIAGNOSIS — M4714 Other spondylosis with myelopathy, thoracic region: Secondary | ICD-10-CM

## 2020-12-09 DIAGNOSIS — R202 Paresthesia of skin: Secondary | ICD-10-CM | POA: Diagnosis not present

## 2020-12-09 DIAGNOSIS — G959 Disease of spinal cord, unspecified: Secondary | ICD-10-CM | POA: Insufficient documentation

## 2020-12-09 NOTE — Progress Notes (Signed)
[facility]  PATIENT: Jonathan Downs DOB: Mar 14, 1966  REFERRING DOCTOR OR PCP: Shirline Frees, MD SOURCE: Patient, notes from Dr. Kenton Kingfisher, imaging reports, MRI images personally reviewed.  _________________________________   HISTORICAL  CHIEF COMPLAINT:  Chief Complaint  Patient presents with   New Patient (Initial Visit)    Rm 2, alone. Here for numbness in both legs and toes, left leg more so. per pt it has worsen over the years and has been causing loss of balance. Pt tries to stay active by walking and exercise at home.     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  UPDATE 12/09/2020  He had T2T3 decompression by Dr. Christella Noa September 2021.  He did not need fusion or hardware.    The surgery was outpatient.    After the surgery he felt mildly better but he continues to experience numbness and weakness.   He has some discomfort in his legs.   Cold water does not feel cold in his legs.  Balance is off.   He has urinary frequency.       History of myelopathy: Since about 2010, he has had progressive left > right leg numbness and mild left > right leg weakness.    He he noted some difficulty standing up and the left foot will sometimes drag if he walks longer distances.  MRI in 2010 showed spinal stenosis at T2-T3 and signal changes within the spinal cord.  Repeat MRI in 2021 showed progression.  The degenerative changes at T2-T3 are just slightly progressed compared to 2011.  However, the abnormal signal changes within the spinal cord from C7-T3 has increased.   He had surgery in September 2021 (T2-T3 laminectomy with decompression) by Dr. Christella Noa.  Additionally, the MRI shows a fluid collection adjacent to the rhomboid muscle on the left.  This has worsened compared  to the small fluid collection back in 2011.  He reports he did have an aspiration at some point but he does not recall details.  He has swelling in the upper left back.  He notes some discomfort at times but not severe pain.  Sometimes the discomfort will radiate to the shoulder.  MRI from 08/20/2019: IMPRESSION: This MRI of the thoracic spine without contrast shows the following: 1.   There is moderate spinal stenosis at T2-T3.  Above this level there is increased signal within the central spinal cord.  The upper limit of the T2 hyperintensity is C7-T1, best seen on the cervical images also performed today.  These changes are consistent with myelopathy.  On the 2011 MRI, the increase signal was only seen from T1-T2 through T2-T3 though there was also more intense T2 hyperintensity consistent with myelopathy both above and below the point of maximal stenosis. 2.   Mild degenerative changes elsewhere in the upper thoracic spine that do not lead to nerve root compression or spinal stenosis. 3.   Fluid collection adjacent to the rhomboid muscles on the left.  This was also noted on the older MRI but has enlarged  IMPRESSION: This MRI of the cervical spine without contrast shows the following: 1.    There is mild spinal stenosis at C3-C4 and C4-C5 and mild to moderate spinal stenosis at C5-C6 due to degenerative changes.  Additionally, there are various degrees of foraminal narrowing  at these levels, worse at C4-C5 where there is possible right and potential left C5 nerve root compression. 2.   There is at least moderate spinal stenosis at T2-T3.  Above this there is increased signal within the spinal cord running up to the C7-T1 interspace consistent with myelopathy.  The extent of the abnormal signal has increased compared to 2011 MRI.   REVIEW OF SYSTEMS: Constitutional: No fevers, chills, sweats, or change in appetite Eyes: No visual changes, double vision, eye pain Ear, nose and throat: No hearing  loss, ear pain, nasal congestion, sore throat Cardiovascular: No chest pain, palpitations Respiratory:  No shortness of breath at rest or with exertion.   No wheezes GastrointestinaI: No nausea, vomiting, diarrhea, abdominal pain, fecal incontinence Genitourinary:  No dysuria, urinary retention or frequency.  No nocturia. Musculoskeletal:  No neck pain, back pain Integumentary: No rash, pruritus, skin lesions Neurological: as above Psychiatric: No depression at this time.  No anxiety Endocrine: No palpitations, diaphoresis, change in appetite, change in weigh or increased thirst Hematologic/Lymphatic:  No anemia, purpura, petechiae. Allergic/Immunologic: No itchy/runny eyes, nasal congestion, recent allergic reactions, rashes  ALLERGIES: No Known Allergies  HOME MEDICATIONS:  Current Outpatient Medications:    amLODipine (NORVASC) 5 MG tablet, Take 5 mg by mouth daily., Disp: , Rfl:   PAST MEDICAL HISTORY: Past Medical History:  Diagnosis Date   Allergic rhinitis    Esophageal reflux    Mild hyperlipidemia    Numbness in feet    Paresthesia of both legs    Prediabetes     PAST SURGICAL HISTORY: Past Surgical History:  Procedure Laterality Date   FOOT SURGERY     corn removed   HERNIA REPAIR     NECK SURGERY     keloid removed    FAMILY HISTORY: Family History  Problem Relation Age of Onset   Diabetes Mother    Heart Problems Father     SOCIAL HISTORY:  Social History   Socioeconomic History   Marital status: Married    Spouse name: Allie Gerhold   Number of children: 4   Years of education: Not on file   Highest education level: Not on file  Occupational History   Occupation: Art gallery manager  Tobacco Use   Smoking status: Former    Pack years: 0.00    Types: Cigars   Smokeless tobacco: Never  Substance and Sexual Activity   Alcohol use: Yes    Comment: weekly   Drug use: No   Sexual activity: Not on file  Other Topics Concern   Not on file  Social  History Narrative   Lives with wife and kids   Right handed    Caffeine AST:MHDQ sometimes   No coffee or tea   Social Determinants of Radio broadcast assistant Strain: Not on file  Food Insecurity: Not on file  Transportation Needs: Not on file  Physical Activity: Not on file  Stress: Not on file  Social Connections: Not on file  Intimate Partner Violence: Not on file     PHYSICAL EXAM  Vitals:   12/09/20 1032  BP: (!) 136/92  Pulse: 61  Weight: 220 lb (99.8 kg)  Height: 5\' 9"  (1.753 m)    Body mass index is 32.49 kg/m.   General: The patient is well-developed and well-nourished and in no acute distress  HEENT:  Head is /AT.  Sclera are anicteric.    Skin: Extremities are without rash or edema.  Musculoskeletal: There is swelling in the  upper left back.  This is just slightly tender.  Neurologic Exam  Mental status: The patient is alert and oriented x 3 at the time of the examination. The patient has apparent normal recent and remote memory, with an apparently normal attention span and concentration ability.   Speech is normal.  Cranial nerves: Extraocular movements are full. There is good facial sensation to soft touch bilaterally.Facial strength is normal.  Trapezius and sternocleidomastoid strength is normal. No dysarthria is noted. No obvious hearing deficits are noted.  Motor:  Muscle bulk is normal.   Tone is normal. Strength is 4+/5 in the left iliopsoas and foot muscles and 5/5 elsewhere  Sensory: Sensory testing is intact to pinprick, soft touch and vibration sensation in the arms.   He notes reduced touch, pinprick and temperature in left leg compared to right and notes altered below T5 dermatome in chest and abdomen on the left but normal on the right.    Vibration sensation is also reduced in the left leg  Coordination: Cerebellar testing reveals good finger-nose-finger and heel-to-shin bilaterally.  Gait and station: Station is normal.   The gait  was normal   Tandem gait mildly wide. Romberg is negative.   Reflexes: Deep tendon reflexes are symmetric in arms, 2- at right knee and 2 at left knee, 1 at rankles.   Plantar responses are flexor.  No clonus.      DIAGNOSTIC DATA (LABS, IMAGING, TESTING) - I reviewed patient records, labs, notes, testing and imaging myself where available.  Lab Results  Component Value Date   WBC 5.5 02/05/2018   HGB 14.3 02/05/2018   HCT 45.8 02/05/2018   MCV 86.1 02/05/2018   PLT 233 02/05/2018      Component Value Date/Time   NA 138 02/05/2018 1840   K 3.2 (L) 02/05/2018 1840   CL 103 02/05/2018 1840   CO2 25 02/05/2018 1840   GLUCOSE 102 (H) 02/05/2018 1840   BUN 16 02/05/2018 1840   CREATININE 1.43 (H) 02/05/2018 1840   CALCIUM 9.4 02/05/2018 1840   GFRNONAA 56 (L) 02/05/2018 1840   GFRAA >60 02/05/2018 1840       ASSESSMENT AND PLAN  Disease of spinal cord ([place]) - Plan: MR THORACIC SPINE WO CONTRAST  Thoracic myelopathy  Numbness and tingling of both legs  1.  Check thoracic spine MRI as noting persistent numbness due to T2-T3 spinal stenosis / myelopathy .  He has h/o decompression.   If any progression, refer back to Dr. Christella Noa.   2.  He could see orthopedic for fluid collection in left scapular/rhomboid region 3.  Return in 6 months or sooner if new or worsening neurologic symptoms.  Essa Wenk A. Felecia Shelling, MD, [facility] 09/08/6254, 3:89 PM Certified in Neurology, Clinical Neurophysiology, Sleep Medicine and Neuroimaging  [facility] [address], [place], [place] [address] [phone]

## 2020-12-09 NOTE — Telephone Encounter (Signed)
MRI thoracic spine wo contrast Dillon Bjork: [id number] (12/09/20-01/07/21)  Sent to GI for scheduling

## 2020-12-10 ENCOUNTER — Ambulatory Visit
Admission: RE | Admit: 2020-12-10 | Discharge: 2020-12-10 | Disposition: A | Discharge status: Home / Self Care | Payer: BC Managed Care – PPO | Source: Ambulatory Visit | Attending: Neurology | Admitting: Neurology

## 2020-12-10 DIAGNOSIS — G959 Disease of spinal cord, unspecified: Secondary | ICD-10-CM

## 2021-01-11 ENCOUNTER — Ambulatory Visit: Payer: BC Managed Care – PPO | Admitting: Podiatry

## 2021-01-11 ENCOUNTER — Other Ambulatory Visit: Payer: Self-pay

## 2021-01-11 ENCOUNTER — Encounter: Payer: Self-pay | Admitting: Podiatry

## 2021-01-11 DIAGNOSIS — M7741 Metatarsalgia, right foot: Secondary | ICD-10-CM

## 2021-01-11 DIAGNOSIS — M2032 Hallux varus (acquired), left foot: Secondary | ICD-10-CM | POA: Diagnosis not present

## 2021-01-11 DIAGNOSIS — M7742 Metatarsalgia, left foot: Secondary | ICD-10-CM | POA: Diagnosis not present

## 2021-01-11 DIAGNOSIS — M205X2 Other deformities of toe(s) (acquired), left foot: Secondary | ICD-10-CM | POA: Diagnosis not present

## 2021-01-11 DIAGNOSIS — M2041 Other hammer toe(s) (acquired), right foot: Secondary | ICD-10-CM | POA: Diagnosis not present

## 2021-01-11 DIAGNOSIS — M2042 Other hammer toe(s) (acquired), left foot: Secondary | ICD-10-CM | POA: Diagnosis not present

## 2021-01-11 DIAGNOSIS — Q828 Other specified congenital malformations of skin: Secondary | ICD-10-CM

## 2021-01-11 NOTE — Patient Instructions (Signed)
Look for urea 40% cream or ointment and apply to the thickened dry skin / calluses. This can be bought over the counter, at a pharmacy or online such as [employer].  

## 2021-01-11 NOTE — Progress Notes (Signed)
  Subjective:  Patient ID: Jonathan Downs, male    DOB: 10-09-1965,  MRN: [id number]  Chief Complaint  Patient presents with   Callouses      (np) place underneath right foot     55 y.o. male presents with the above complaint. History confirmed with patient.  Has a hard painful lesion under the big toe on the left foot  Objective:  Physical Exam: warm, good capillary refill, no trophic changes or ulcerative lesions, normal DP and PT pulses, and normal sensory exam.  Pes planus bilaterally.  He has hammertoes bilateral with hallux malleus deformity.  Submetatarsal 1 porokeratosis and callus submetatarsal 5 on the left foot and 1 and 5 on the right foot less severe Assessment:   1. Porokeratosis   2. Hammertoe of left foot   3. Hammertoe of right foot   4. Hallux limitus, left   5. Hallux malleus, left      Plan:  Patient was evaluated and treated and all questions answered.  All symptomatic hyperkeratoses were safely debrided with a sterile #15 blade to patient's level of comfort without incident. We discussed preventative and palliative care of these lesions including supportive and accommodative shoegear, padding, prefabricated and custom molded accommodative orthoses, use of a pumice stone and lotions/creams daily.  I recommended urea cream for these.  We also discussed custom molded orthoses and he was casted for these today to provide a longitudinal arch support and a cut out for the first ray and unload on the first and fifth met to accommodate the callus lesions.   Return in about 8 weeks (around 03/08/2021) for follow up on callus and orthotics .

## 2021-02-14 ENCOUNTER — Other Ambulatory Visit: Payer: BC Managed Care – PPO

## 2021-02-14 ENCOUNTER — Telehealth: Payer: Self-pay | Admitting: *Deleted

## 2021-02-14 ENCOUNTER — Other Ambulatory Visit: Payer: Self-pay

## 2021-02-14 NOTE — Telephone Encounter (Signed)
Puo-02/14/21

## 2021-03-08 ENCOUNTER — Ambulatory Visit: Payer: BC Managed Care – PPO | Admitting: Podiatry

## 2021-03-08 ENCOUNTER — Other Ambulatory Visit: Payer: Self-pay

## 2021-03-08 DIAGNOSIS — M7742 Metatarsalgia, left foot: Secondary | ICD-10-CM | POA: Diagnosis not present

## 2021-03-08 DIAGNOSIS — B353 Tinea pedis: Secondary | ICD-10-CM

## 2021-03-08 DIAGNOSIS — Q828 Other specified congenital malformations of skin: Secondary | ICD-10-CM

## 2021-03-08 DIAGNOSIS — M7741 Metatarsalgia, right foot: Secondary | ICD-10-CM

## 2021-03-08 MED ORDER — TERBINAFINE HCL 250 MG PO TABS: 250.0000 mg | ORAL_TABLET | Freq: Every day | ORAL | 0 refills | Status: AC

## 2021-03-08 MED ORDER — CLOTRIMAZOLE-BETAMETHASONE 1-0.05 % EX CREA: 1.0000 "application " | TOPICAL_CREAM | Freq: Two times a day (BID) | CUTANEOUS | 0 refills | Status: DC

## 2021-03-08 NOTE — Progress Notes (Signed)
  Subjective:  Patient ID: Jonathan Downs, male    DOB: 12/03/1965,  MRN: [id number]  Chief Complaint  Patient presents with   Callouses    Follow up left foot porokeratosis. Pt states calluses are well shaved not complaints. Pt requested to have a Rx for dry itchy feet.    Foot Orthotics    Follow up new orthotic check. Pt states the new orthotics have helped with foot pain.     55 y.o. male presents with the above complaint. History confirmed with patient.  Lesion is doing much better has been taking care of it himself and his custom orthotics have been very helpful.  He has a few new issue with very dry feet with itching and scaling  Objective:  Physical Exam: warm, good capillary refill, no trophic changes or ulcerative lesions, normal DP and PT pulses, and normal sensory exam.  Pes planus bilaterally.  He has hammertoes bilateral with hallux malleus deformity.  Calluses much better than before and nontender today.  He has dry scaling skin in the forefoot and around the toes as well as a eczematous rash on the dorsal forefoot on the left Assessment:   1. Tinea pedis of both feet   2. Porokeratosis   3. Metatarsalgia of both feet      Plan:  Patient was evaluated and treated and all questions answered.  Continue home care with urea cream as well as pumice stone and routine debridement, keep using orthotics as they have been very helpful   Discussed the etiology and treatment options for tinea pedis.  Discussed topical and oral treatment.  Recommended dual treatment with 4-week course of terbinafine as well as Lotrisone cream.  This was sent to the patient's pharmacy.  Also discussed appropriate foot hygiene, use of antifungal spray such as Tinactin in shoes, as well as cleaning foot surfaces such as showers and bathroom floors with bleach.  If no better in 4 to 6 weeks he will return to see me as needed for this   Return if symptoms worsen or fail to improve.

## 2021-07-25 ENCOUNTER — Emergency Department (HOSPITAL_COMMUNITY)
Admission: EM | Admit: 2021-07-25 | Discharge: 2021-07-26 | Disposition: A | Discharge status: Home / Self Care | Payer: BC Managed Care – PPO | Attending: Emergency Medicine | Admitting: Emergency Medicine

## 2021-07-25 ENCOUNTER — Ambulatory Visit (HOSPITAL_COMMUNITY)
Admission: EM | Admit: 2021-07-25 | Discharge: 2021-07-25 | Disposition: A | Discharge status: Home / Self Care | Payer: BC Managed Care – PPO | Attending: Family Medicine | Admitting: Family Medicine

## 2021-07-25 ENCOUNTER — Encounter (HOSPITAL_COMMUNITY): Payer: Self-pay

## 2021-07-25 ENCOUNTER — Emergency Department (HOSPITAL_COMMUNITY): Payer: BC Managed Care – PPO

## 2021-07-25 ENCOUNTER — Other Ambulatory Visit: Payer: Self-pay

## 2021-07-25 ENCOUNTER — Encounter (HOSPITAL_COMMUNITY): Payer: Self-pay | Admitting: Emergency Medicine

## 2021-07-25 DIAGNOSIS — R079 Chest pain, unspecified: Secondary | ICD-10-CM | POA: Diagnosis not present

## 2021-07-25 DIAGNOSIS — R0789 Other chest pain: Secondary | ICD-10-CM | POA: Diagnosis not present

## 2021-07-25 LAB — CBC
HCT: 43.9 % (ref 39.0–52.0)
Hemoglobin: 13.6 g/dL (ref 13.0–17.0)
MCH: 26.7 pg (ref 26.0–34.0)
MCHC: 31 g/dL (ref 30.0–36.0)
MCV: 86.2 fL (ref 80.0–100.0)
Platelets: 225 10*3/uL (ref 150–400)
RBC: 5.09 MIL/uL (ref 4.22–5.81)
RDW: 13.2 % (ref 11.5–15.5)
WBC: 5.8 10*3/uL (ref 4.0–10.5)
nRBC: 0 % (ref 0.0–0.2)

## 2021-07-25 LAB — BASIC METABOLIC PANEL
Anion gap: 10 (ref 5–15)
BUN: 15 mg/dL (ref 6–20)
CO2: 24 mmol/L (ref 22–32)
Calcium: 8.7 mg/dL — ABNORMAL LOW (ref 8.9–10.3)
Chloride: 99 mmol/L (ref 98–111)
Creatinine, Ser: 0.97 mg/dL (ref 0.61–1.24)
GFR, Estimated: >60 mL/min (ref >60)
Glucose, Bld: 92 mg/dL (ref 70–99)
Potassium: 3.5 mmol/L (ref 3.5–5.1)
Sodium: 133 mmol/L — ABNORMAL LOW (ref 135–145)

## 2021-07-25 LAB — TROPONIN I (HIGH SENSITIVITY)
Troponin I (High Sensitivity): 7 ng/L (ref <18)
Troponin I (High Sensitivity): 6 ng/L (ref <18)

## 2021-07-25 NOTE — ED Triage Notes (Signed)
Sent from UC with sternal cp that radiates to right chest and back. Pain began today while laying down in bed. UC states changes from last EKG in 2019

## 2021-07-25 NOTE — Discharge Instructions (Signed)
Go to the emergency room for further evaluation and management as we discussed. 

## 2021-07-25 NOTE — ED Notes (Signed)
Patient is being discharged from the Urgent Care and sent to the Emergency Department via POV . Per Raspet, PA, patient is in need of higher level of care due to family history, new BBB, complaints of chest pain. Patient is aware and verbalizes understanding of plan of care.  Vitals:   07/25/21 1849  BP: (!) 153/94  Pulse: (!) 57  Resp: 18  Temp: 98 F (36.7 C)  SpO2: 98%

## 2021-07-25 NOTE — ED Provider Notes (Signed)
[facility]    CSN: [id number] Arrival date & time: 07/25/21  1744      History   Chief Complaint Chief Complaint  Patient presents with   Chest Pain    HPI Jonathan Downs is a 56 y.o. male.   Patient presents today with a several hour history of right-sided chest pain.  He reports pain is rated 7/8 on a 0-10 pain scale, described as an intense soreness/pressure, worse with certain movements, no relieving factors identified.  He has not tried any over-the-counter medication.  Denies any known injury or increase in activity prior to symptom onset.  Denies any recent illness or additional symptoms including cough or congestion.  He denies any shortness of breath, diaphoresis, nausea, vomiting, lightheadedness.  He denies any personal history of diabetes but does have a history of hyperlipidemia and hypertension.  He reports a family history of cardiovascular disease in his father; unclear age of onset for father but reports that he died in his 54s.  He reports a pleuritic character to pain.  He denies any recent immobilization, hospitalization, COVID-19, exogenous hormone use, recent travel.  He denies history of malignancy.   Past Medical History:  Diagnosis Date   Allergic rhinitis    Esophageal reflux    Mild hyperlipidemia    Numbness in feet    Paresthesia of both legs    Prediabetes     Patient Active Problem List   Diagnosis Date Noted   Disease of spinal cord ([place]) 12/09/2020   Elevated blood-pressure reading, without diagnosis of hypertension 04/19/2020   Body mass index (BMI) 32.0-32.9, adult 03/02/2020   Lipoma of back 02/02/2020   Essential (primary) hypertension 01/08/2020   Thoracic myelopathy 08/05/2019   Numbness and tingling of both legs 08/05/2019   Thoracic spinal stenosis 08/05/2019    Past Surgical History:  Procedure Laterality Date   FOOT SURGERY     corn removed   HERNIA REPAIR     NECK SURGERY     keloid removed       Home  Medications    Prior to Admission medications   Medication Sig Start Date End Date Taking? Authorizing Provider  amLODipine (NORVASC) 5 MG tablet Take 5 mg by mouth daily. 09/25/19   [provider]  clotrimazole (LOTRIMIN) 1 % cream Apply 1 application topically 2 (two) times daily. 08/24/20   [provider]  clotrimazole-betamethasone (LOTRISONE) cream Apply 1 application topically 2 (two) times daily. 03/08/21   McDonald, Stephan Minister, DPM  rosuvastatin (CRESTOR) 10 MG tablet Take 10 mg by mouth at bedtime. 01/10/21   [provider]  tamsulosin (FLOMAX) 0.4 MG CAPS capsule Take 0.4 mg by mouth daily. 01/10/21   [provider]  tiZANidine (ZANAFLEX) 4 MG tablet Take by mouth. Patient not taking: Reported on 07/25/2021 02/11/20   [provider]  triamcinolone ointment (KENALOG) 0.1 % Apply topically 2 (two) times daily. 12/04/20   [provider]    Family History Family History  Problem Relation Age of Onset   Diabetes Mother    Heart Problems Father     Social History Social History   Tobacco Use   Smoking status: Former    Types: Cigars   Smokeless tobacco: Never  Vaping Use   Vaping Use: Never used  Substance Use Topics   Alcohol use: Yes    Comment: weekly   Drug use: Yes    Types: Marijuana     Allergies   Patient has  no known allergies.   Review of Systems Review of Systems  Constitutional:  Positive for activity change. Negative for appetite change, fatigue and fever.  HENT:  Negative for congestion, sinus pressure, sneezing and sore throat.   Respiratory:  Negative for cough and shortness of breath.   Cardiovascular:  Positive for chest pain.  Gastrointestinal:  Negative for abdominal pain, diarrhea, nausea and vomiting.  Neurological:  Negative for dizziness, light-headedness and headaches.    Physical Exam Triage Vital Signs ED Triage Vitals  Enc Vitals Group     BP 07/25/21 1849 (!) 153/94     Pulse Rate  07/25/21 1849 (!) 57     Resp 07/25/21 1849 18     Temp 07/25/21 1849 98 F (36.7 C)     Temp Source 07/25/21 1849 Oral     SpO2 07/25/21 1849 98 %     Weight --      Height --      Head Circumference --      Peak Flow --      Pain Score 07/25/21 1845 7     Pain Loc --      Pain Edu? --      Excl. in [place]? --    No data found.  Updated Vital Signs BP (!) 153/94 (BP Location: Right Arm) Comment (BP Location): large cuff   Pulse (!) 57    Temp 98 F (36.7 C) (Oral)    Resp 18    SpO2 98%   Visual Acuity Right Eye Distance:   Left Eye Distance:   Bilateral Distance:    Right Eye Near:   Left Eye Near:    Bilateral Near:     Physical Exam Vitals reviewed.  Constitutional:      General: He is awake.     Appearance: Normal appearance. He is well-developed. He is not ill-appearing.     Comments: Very pleasant male appears stated age in no acute distress sitting comfortably in exam room  HENT:     Head: Normocephalic and atraumatic.     Mouth/Throat:     Pharynx: No oropharyngeal exudate, posterior oropharyngeal erythema or uvula swelling.  Cardiovascular:     Rate and Rhythm: Normal rate and regular rhythm.     Heart sounds: Normal heart sounds, S1 normal and S2 normal. No murmur heard. Pulmonary:     Effort: Pulmonary effort is normal.     Breath sounds: Normal breath sounds. No stridor. No wheezing, rhonchi or rales.     Comments: Clear to auscultation bilaterally Chest:     Chest wall: No deformity, swelling or tenderness.     Comments: No deformity or tenderness on exam.  Pain is not reproducible on exam. Neurological:     Mental Status: He is alert.  Psychiatric:        Behavior: Behavior is cooperative.     UC Treatments / Results  Labs (all labs ordered are listed, but only abnormal results are displayed) Labs Reviewed - No data to display  EKG   Radiology No results found.  Procedures Procedures (including critical care time)  Medications Ordered  in UC Medications - No data to display  Initial Impression / Assessment and Plan / UC Course  I have reviewed the triage vital signs and the nursing notes.  Pertinent labs & imaging results that were available during my care of the patient were reviewed by me and considered in my medical decision making (see chart for details).  EKG obtained showed sinus bradycardia with ventricular rate of 53 bpm with new right bundle branch block compared to 2019 tracing.  Pain is not reproducible on exam.  Discussed that given risk factors and change in EKG you should be evaluated in the emergency room since we do not have capabilities for monitoring or troponins in urgent care.  Patient is agreeable to this and will go directly to the emergency room for further evaluation management.  Vital signs were stable at time of discharge and he was safe for private transport.  His wife will accompany him to the emergency room.  Final Clinical Impressions(s) / UC Diagnoses   Final diagnoses:  Chest pain, unspecified type     Discharge Instructions      Go to the emergency room for further evaluation and management as we discussed.    ED Prescriptions   None    PDMP not reviewed this encounter.   Terrilee Croak, PA-C 07/25/21 1935

## 2021-07-25 NOTE — ED Triage Notes (Signed)
Right chest soreness that started today.  Deep breathing makes pain worse, turning torso makes chest on right side of torso hurt.  No sob.  No nausea, no vomiting.  Denies cough or cold recentlydenies falls

## 2021-07-25 NOTE — ED Provider Triage Note (Signed)
Emergency Medicine Provider Triage Evaluation Note  Jonathan Downs , a 56 y.o. male  was evaluated in triage.  Pt complains of sternal chest pain that radiates to right chest and back.  Started today, while he was laying down in bed.  He sent from urgent care with concern for new right bundle branch block.  An EKG performed in 2019.  Review of Systems  Positive: Chest pain worse with deep breathing Negative: Fevers, chills, cough, congestion, SOB  Physical Exam  BP (!) 157/102    Pulse (!) 56    Temp 97.6 F (36.4 C) (Oral)    Resp 18    Ht 5\' 9"  (1.753 m)    Wt 99.8 kg    SpO2 99%    BMI 32.49 kg/m  Gen:   Awake, no distress   Resp:  Normal effort  MSK:   Moves extremities without difficulty  Other:    Medical Decision Making  Medically screening exam initiated at 7:48 PM.  Appropriate orders placed.  Jonathan Downs was informed that the remainder of the evaluation will be completed by another provider, this initial triage assessment does not replace that evaluation, and the importance of remaining in the ED until their evaluation is complete.     Jonathan Plummer, PA-C 07/25/21 1948

## 2021-07-26 NOTE — ED Provider Notes (Signed)
[facility] Emergency Department Provider Note MRN:  [id number]  Arrival date & time: 07/26/21     Chief Complaint   Chest Pain   History of Present Illness   Jonathan Downs is a 56 y.o. year-old male presents to the ED with chief complaint of right sided chest pain.  States that onset was earlier today.  He was seen earlier at urgent care and was referred to the emergency department because of EKG changes.  Patient denies having shortness of breath or diaphoresis.  He states that he did have some increased pain when he take a deep breath and when he would move/bend, but all of his symptoms have resolved now.  He denies history of heart problems.  History provided by patient.  Review of Systems  Pertinent review of systems noted in HPI.    Physical Exam   Vitals:   07/25/21 2236 07/26/21 0142  BP: (!) 136/92 124/90  Pulse: (!) 54 (!) 59  Resp: 16 15  Temp:  98.1 F (36.7 C)  SpO2: 97% 99%    CONSTITUTIONAL:  well-appearing, NAD NEURO:  Alert and oriented x 3, CN 3-12 grossly intact EYES:  eyes equal and reactive ENT/NECK:  Supple, no stridor  CARDIO:  normal rate, regular rhythm, appears well-perfused  PULM:  No respiratory distress, CTAB GI/GU:  non-distended,  MSK/SPINE:  No gross deformities, no edema, moves all extremities  SKIN:  no rash, atraumatic   *Additional and/or pertinent findings included in MDM below  Diagnostic and Interventional Summary    EKG Interpretation  Date/Time:  Monday July 25 2021 20:11:23 EST Ventricular Rate:  53 PR Interval:  166 QRS Duration: 148 QT Interval:  468 QTC Calculation: 439 R Axis:   82 Text Interpretation: Sinus bradycardia Right bundle branch block Abnormal ECG When compared with ECG of 25-Jul-2021 19:07, PREVIOUS ECG IS PRESENT Confirmed by Orpah Greek [phone]) on 07/26/2021 3:13:07 AM       Labs Reviewed  BASIC METABOLIC PANEL - Abnormal; Notable for the following  components:      Result Value   Sodium 133 (*)    Calcium 8.7 (*)    All other components within normal limits  CBC  TROPONIN I (HIGH SENSITIVITY)  TROPONIN I (HIGH SENSITIVITY)    DG Chest 2 View  Final Result      Medications - No data to display   Procedures  /  Critical Care Procedures  ED Course and Medical Decision Making  I have reviewed the triage vital signs, the nursing notes, and pertinent available records from the EMR.  Complexity of Problems Addressed: High Complexity: Acute illness/injury posing a threat to life or bodily function, requiring emergent diagnostic workup, evaluation, and treatment as below.  ED Course:    After considering the following differential, ACS, PE, dissection, I reviewed and agree with orders placed in triage.  I personally interpreted the labs which are notable for negative troponins I visualized the chest x-ray which is notable for cardiomegaly and agree with the radiologist interpretation. EKG.  Well's PE criteria is 0, indicating low risk of PE in the emergency department population.  Heart score is 2.   Social Determinants Affecting Care:     Consultants: No consultations were needed in caring for this patient.   Treatment and Plan:  Patient here with an episode of chest pain earlier today that was reproducible with movement and with deep breathing.  His symptoms have resolved completely.  I considered admission  due to patient's initial presentation, but after considering the examination and diagnostic results, patient will not require admission and can be discharged with outpatient follow-up.  Patient discussed with attending physician, Dr. Betsey Holiday, who agrees with plan for outpatient follow-up given reassuring ED workup.  Final Clinical Impressions(s) / ED Diagnoses     ICD-10-CM   1. Right-sided chest pain  R07.9       ED Discharge Orders     None        Discharge Instructions Discussed with and  Provided to Patient:   Discharge Instructions   None      Montine Circle, PA-C 07/26/21 [id number]    Orpah Greek, MD 07/27/21 [phone]

## 2021-07-26 NOTE — ED Notes (Signed)
Pt discharged and ambulated out of the ED without discharge papers.

## 2022-03-16 ENCOUNTER — Ambulatory Visit: Payer: BC Managed Care – PPO | Admitting: Podiatry

## 2022-03-16 DIAGNOSIS — B353 Tinea pedis: Secondary | ICD-10-CM | POA: Diagnosis not present

## 2022-03-16 MED ORDER — CLOTRIMAZOLE-BETAMETHASONE 1-0.05 % EX CREA: 1.0000 | TOPICAL_CREAM | Freq: Two times a day (BID) | CUTANEOUS | 3 refills | Status: DC

## 2022-03-16 MED ORDER — TERBINAFINE HCL 250 MG PO TABS: 250.0000 mg | ORAL_TABLET | Freq: Every day | ORAL | 0 refills | Status: AC

## 2022-03-19 NOTE — Progress Notes (Signed)
  Subjective:  Patient ID: TANNAR BROKER, male    DOB: December 13, 1965,  MRN: [id number]  Chief Complaint  Patient presents with   Tinea Pedis    Feet are dry and cracking   Callouses    Painful benign skin lesions    56 y.o. male presents with the above complaint. History confirmed with patient.  Was doing well but the athlete's foot has returned and is much worse  Objective:  Physical Exam: warm, good capillary refill, no trophic changes or ulcerative lesions, normal DP and PT pulses, and normal sensory exam.  Pes planus bilaterally.  He has hammertoes bilateral with hallux malleus deformity.  Calluses much better than before and nontender today.  He has dry scaling skin in the forefoot and around the toes as well as a eczematous rash on the dorsal forefoot on the left Assessment:   1. Tinea pedis of both feet      Plan:  Patient was evaluated and treated and all questions answered.      Discussed the etiology and treatment options for tinea pedis.  Discussed topical and oral treatment.  Recommended dual treatment with 42-monthof terbinafine as well as Lotrisone cream.  This was sent to the patient's pharmacy.  Also discussed appropriate foot hygiene, use of antifungal spray such as Tinactin in shoes, as well as cleaning foot surfaces such as showers and bathroom floors with bleach.  If no better in 4 to 6 weeks he will return to see me as needed for this   Return in about 3 months (around 06/16/2022) for follow up on Tinea pedis .

## 2022-03-20 ENCOUNTER — Other Ambulatory Visit: Payer: Self-pay

## 2022-03-20 DIAGNOSIS — L603 Nail dystrophy: Secondary | ICD-10-CM

## 2022-04-12 ENCOUNTER — Encounter: Payer: Self-pay | Admitting: Podiatry

## 2022-06-20 ENCOUNTER — Ambulatory Visit: Payer: BC Managed Care – PPO | Admitting: Podiatry

## 2022-06-20 DIAGNOSIS — L309 Dermatitis, unspecified: Secondary | ICD-10-CM | POA: Diagnosis not present

## 2022-06-20 MED ORDER — TACROLIMUS 0.1 % EX OINT: TOPICAL_OINTMENT | Freq: Every day | CUTANEOUS | 1 refills | Status: AC

## 2022-06-20 MED ORDER — CLOBETASOL PROPIONATE 0.05 % EX OINT: 1.0000 | TOPICAL_OINTMENT | Freq: Every day | CUTANEOUS | 1 refills | Status: AC

## 2022-06-20 NOTE — Progress Notes (Signed)
Referral was faxed over today. 

## 2022-06-20 NOTE — Progress Notes (Signed)
  Subjective:  Patient ID: Jonathan Downs, male    DOB: [birth year],  MRN: [id number]  Chief Complaint  Patient presents with   Tinea Pedis    follow up on Tinea pedis Left > Right    57 y.o. male presents with the above complaint. History confirmed with patient.  Skin still very itchy and dry, he has almost finished the Lamisil, has been using the cream twice daily  Objective:  Physical Exam: warm, good capillary refill, no trophic changes or ulcerative lesions, normal DP and PT pulses, and normal sensory exam.  Pes planus bilaterally.  He has hammertoes bilateral with hallux malleus deformity.  Calluses much better than before and nontender today.  He has dry scaling skin in the forefoot and around the toes as well as a eczematous rash on the dorsal forefoot on the left Assessment:   1. Dermatitis of both feet      Plan:  Patient was evaluated and treated and all questions answered.  Suspect residual dermatitis is the issue here, fungal involvement seems to be controlled at this point.  I recommend discontinuing the Lotrisone ointment, complete his Lamisil and switch to tacrolimus ointment once a day and clobetasol ointment once a day alternating 1 medication in the morning and 1 in the evenings.  I also placed a dermatology referral and I will see him back in 2 months.  He will schedule dermatology referral for 3-4 months from now in the event he has not improved by the next visit.  Return in about 2 months (around 08/19/2022) for f/u on dermatitis.

## 2022-08-09 IMAGING — CR DG CHEST 2V
2 series · 2 of 2 positions shown · non-contrast
Comparison: 02/05/2018

CLINICAL DATA: Chest pain

EXAM:
CHEST - 2 VIEW

[chest pa]
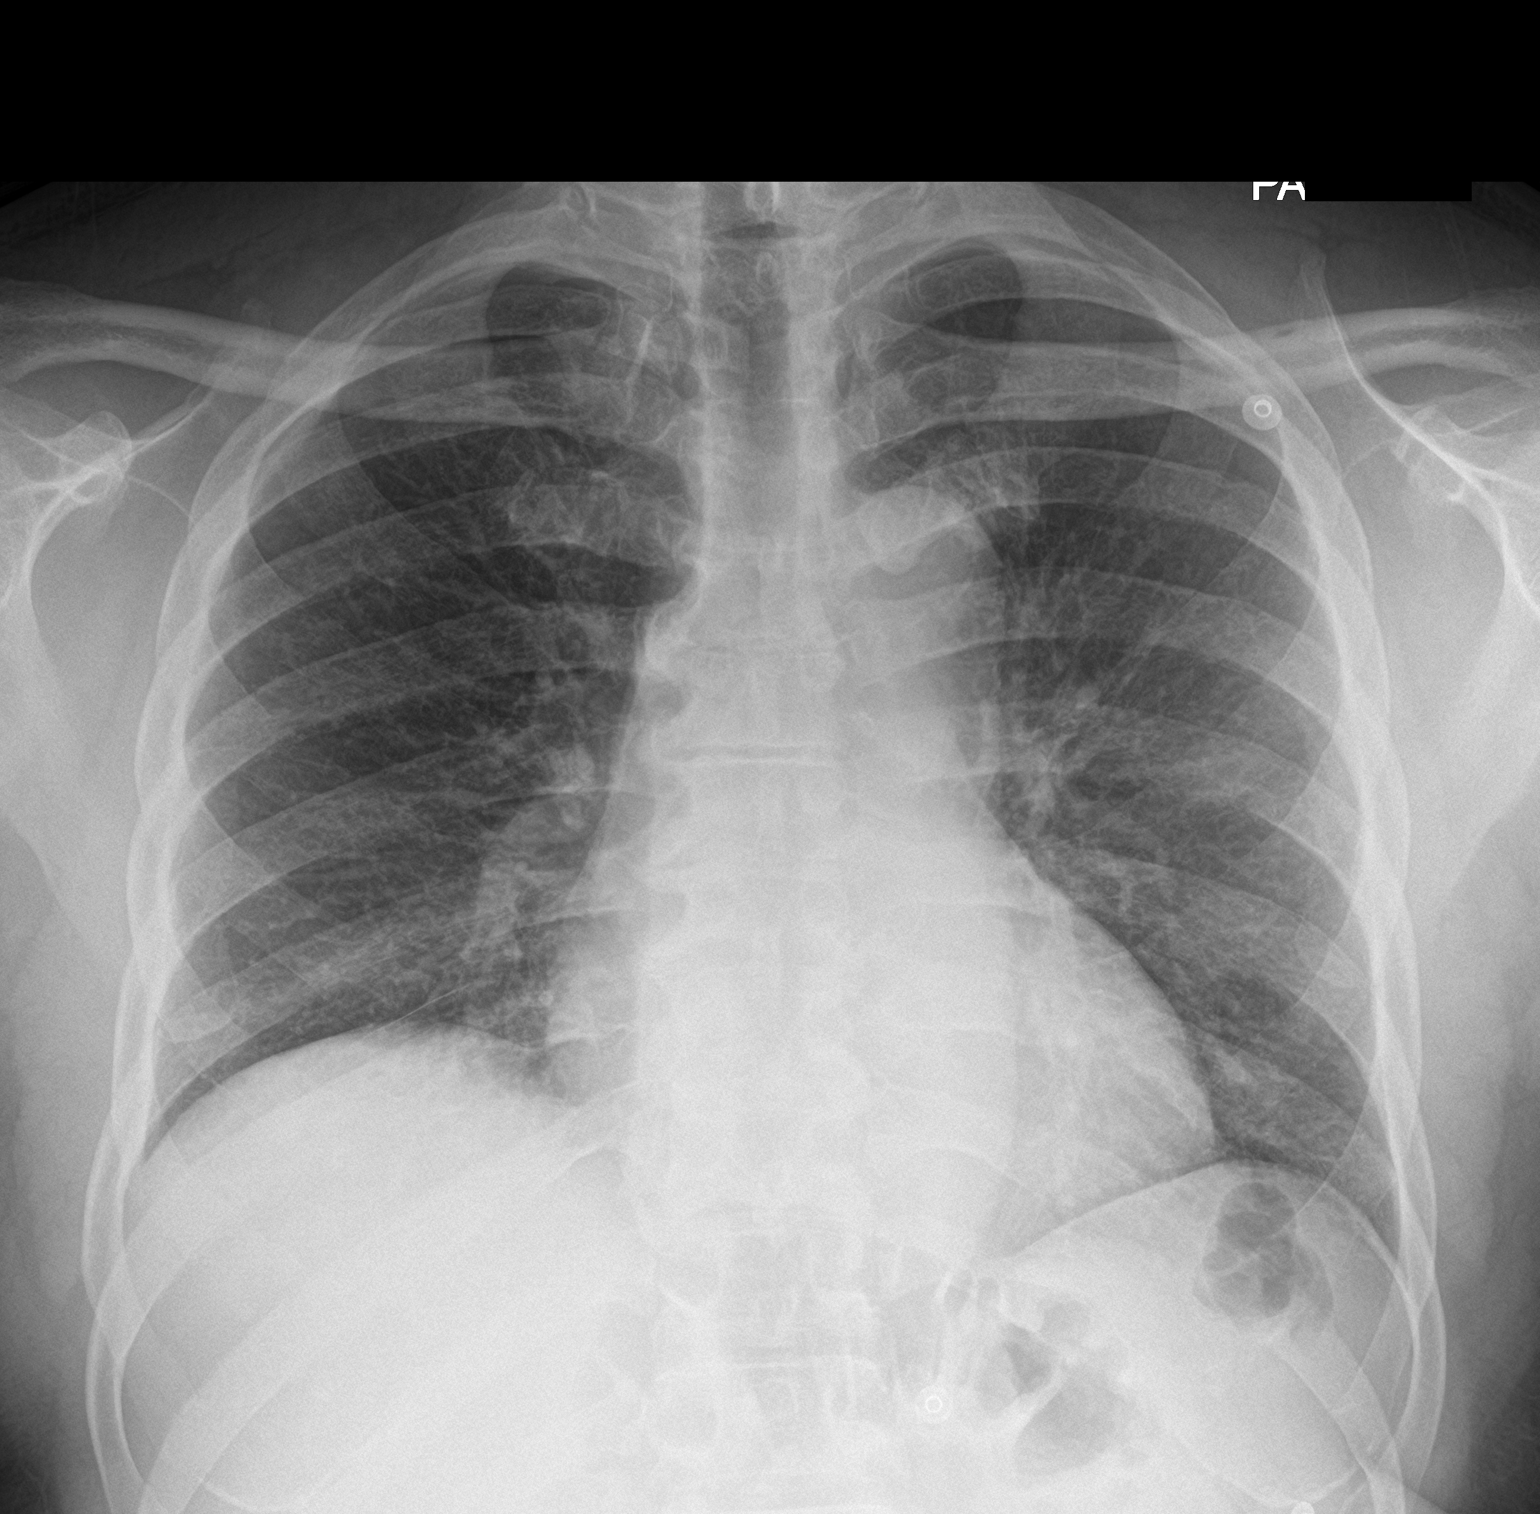

[chest lat]
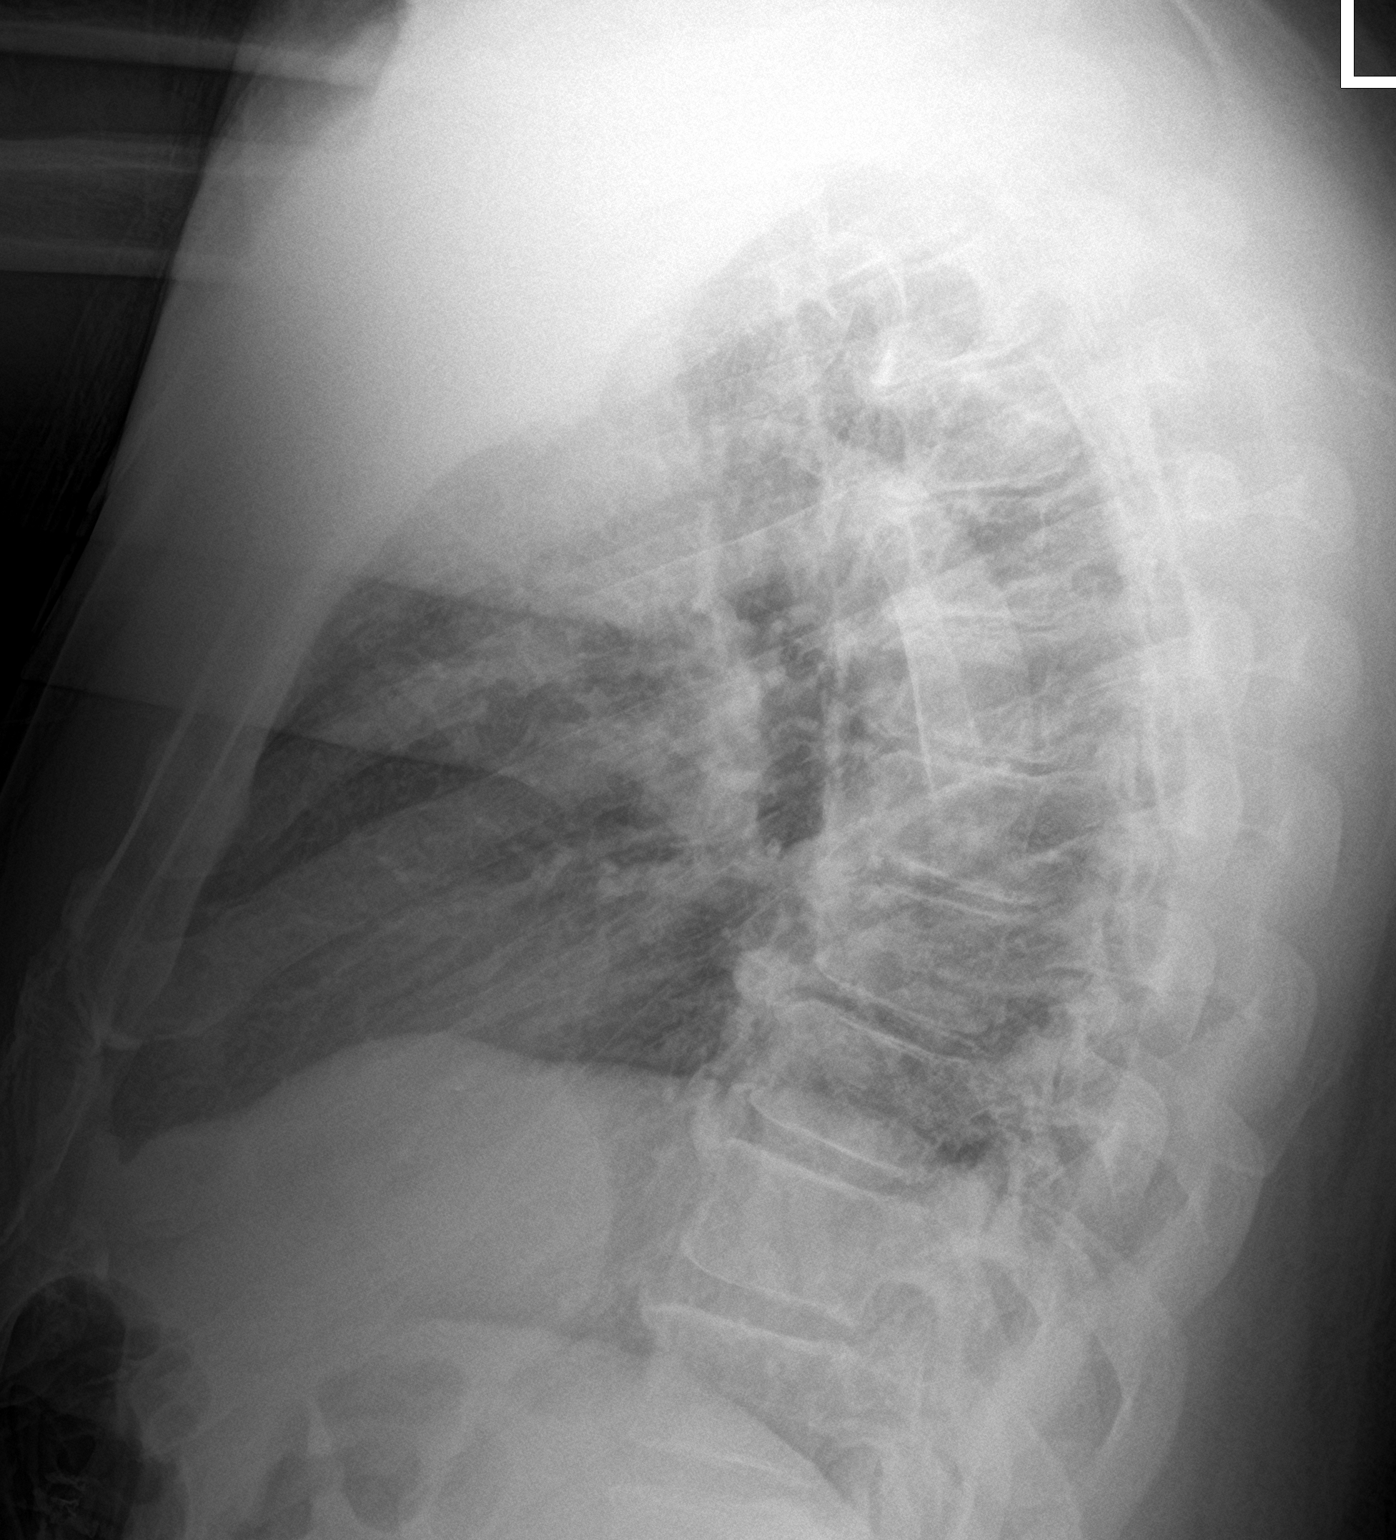

[2 of 2 positions shown; findings below may reference images not displayed]

FINDINGS: Transverse diameter of heart is increased. Thoracic aorta is
tortuous and ectatic. Lung fields are clear of any infiltrates or
pulmonary edema. There is no pleural effusion or pneumothorax.
IMPRESSION: Cardiomegaly. There are no signs of pulmonary edema or focal
pulmonary consolidation.

## 2022-08-22 ENCOUNTER — Ambulatory Visit: Payer: BC Managed Care – PPO | Admitting: Podiatry

## 2022-08-22 DIAGNOSIS — M79671 Pain in right foot: Secondary | ICD-10-CM

## 2022-08-22 DIAGNOSIS — M79672 Pain in left foot: Secondary | ICD-10-CM

## 2022-08-22 DIAGNOSIS — Q828 Other specified congenital malformations of skin: Secondary | ICD-10-CM | POA: Diagnosis not present

## 2022-08-23 NOTE — Progress Notes (Signed)
  Subjective:  Patient ID: Jonathan Downs, male    DOB: December 21, 1965,  MRN: [id number]  Chief Complaint  Patient presents with   Tinea Pedis    Follow up dermatitis    57 y.o. male presents with the above complaint. History confirmed with patient.  Skin is doing much better less itchy and has been using the cream.  Objective:  Physical Exam: warm, good capillary refill, no trophic changes or ulcerative lesions, normal DP and PT pulses, and normal sensory exam.  Pes planus bilaterally.  He has hammertoes bilateral with hallux malleus deformity.  Calluses submetatarsal 5 and 1 bilateral are tender today.  He has dry scaling skin in the forefoot and around the toes as well as a eczematous rash on the dorsal forefoot on the left, much since last visit Assessment:   1. Porokeratosis   2. Pain in both feet      Plan:  Patient was evaluated and treated and all questions answered.  Continue using tacrolimus and clobetasol for his dermatitis.  All symptomatic hyperkeratoses were safely debrided with a sterile #15 blade to patient's level of comfort without incident. We discussed preventative and palliative care of these lesions including supportive and accommodative shoegear, padding, prefabricated and custom molded accommodative orthoses, use of a pumice stone and lotions/creams daily.   Return if symptoms worsen or fail to improve.

## 2022-09-19 ENCOUNTER — Other Ambulatory Visit: Payer: Self-pay | Admitting: Family Medicine

## 2022-09-19 DIAGNOSIS — R319 Hematuria, unspecified: Secondary | ICD-10-CM

## 2022-09-25 ENCOUNTER — Ambulatory Visit
Admission: RE | Admit: 2022-09-25 | Discharge: 2022-09-25 | Disposition: A | Discharge status: Home / Self Care | Payer: BC Managed Care – PPO | Source: Ambulatory Visit | Attending: Family Medicine | Admitting: Family Medicine

## 2022-09-25 DIAGNOSIS — R319 Hematuria, unspecified: Secondary | ICD-10-CM

## 2022-09-25 MED ORDER — IOPAMIDOL (ISOVUE-300) INJECTION 61%
125.0000 mL | Freq: Once | INTRAVENOUS | Status: AC | PRN
  Administered 2022-09-25: 125 mL via INTRAVENOUS

## 2023-04-16 ENCOUNTER — Other Ambulatory Visit: Payer: Self-pay | Admitting: Family Medicine

## 2023-04-16 DIAGNOSIS — M4807 Spinal stenosis, lumbosacral region: Secondary | ICD-10-CM

## 2023-04-26 ENCOUNTER — Encounter: Payer: Self-pay | Admitting: Family Medicine

## 2023-05-06 ENCOUNTER — Ambulatory Visit
Admission: RE | Admit: 2023-05-06 | Discharge: 2023-05-06 | Disposition: A | Discharge status: Home / Self Care | Payer: BC Managed Care – PPO | Source: Ambulatory Visit | Attending: Family Medicine | Admitting: Family Medicine

## 2023-05-06 DIAGNOSIS — M4807 Spinal stenosis, lumbosacral region: Secondary | ICD-10-CM
